# Patient Record
Sex: Male | Born: 1965 | Race: White | Hispanic: No | Marital: Married | Smoking: Former smoker
Health system: Southern US, Community
[De-identification: ages and names within clinical notes are randomized; demographics above are authoritative.]

## PROBLEM LIST (undated history)

## (undated) DIAGNOSIS — F411 Generalized anxiety disorder: Secondary | ICD-10-CM

## (undated) DIAGNOSIS — Z8489 Family history of other specified conditions: Secondary | ICD-10-CM

## (undated) DIAGNOSIS — E119 Type 2 diabetes mellitus without complications: Secondary | ICD-10-CM

## (undated) DIAGNOSIS — K219 Gastro-esophageal reflux disease without esophagitis: Secondary | ICD-10-CM

## (undated) DIAGNOSIS — Z8659 Personal history of other mental and behavioral disorders: Secondary | ICD-10-CM

## (undated) DIAGNOSIS — I10 Essential (primary) hypertension: Secondary | ICD-10-CM

## (undated) HISTORY — DX: Personal history of other mental and behavioral disorders: Z86.59

## (undated) HISTORY — DX: Gastro-esophageal reflux disease without esophagitis: K21.9

## (undated) HISTORY — PX: COLONOSCOPY: SHX174

## (undated) HISTORY — DX: Type 2 diabetes mellitus without complications: E11.9

## (undated) HISTORY — DX: Essential (primary) hypertension: I10

## (undated) HISTORY — DX: Generalized anxiety disorder: F41.1

---

## 1995-01-11 HISTORY — PX: APPENDECTOMY: SHX54

## 1997-01-10 HISTORY — PX: VASECTOMY: SHX75

## 2000-10-19 ENCOUNTER — Inpatient Hospital Stay (HOSPITAL_COMMUNITY): Admission: EM | Admit: 2000-10-19 | Discharge: 2000-10-21 | Payer: Self-pay | Admitting: *Deleted

## 2000-10-19 ENCOUNTER — Encounter: Payer: Self-pay | Admitting: *Deleted

## 2000-10-19 ENCOUNTER — Encounter: Payer: Self-pay | Admitting: Internal Medicine

## 2000-10-21 ENCOUNTER — Encounter: Payer: Self-pay | Admitting: Endocrinology

## 2001-03-14 ENCOUNTER — Encounter: Admission: RE | Admit: 2001-03-14 | Discharge: 2001-06-12 | Payer: Self-pay | Admitting: Internal Medicine

## 2004-01-22 ENCOUNTER — Ambulatory Visit: Payer: Self-pay | Admitting: Internal Medicine

## 2004-04-12 ENCOUNTER — Ambulatory Visit: Payer: Self-pay | Admitting: Internal Medicine

## 2004-07-16 ENCOUNTER — Ambulatory Visit: Payer: Self-pay | Admitting: Internal Medicine

## 2004-10-18 ENCOUNTER — Ambulatory Visit: Payer: Self-pay | Admitting: Internal Medicine

## 2004-12-14 ENCOUNTER — Ambulatory Visit: Payer: Self-pay | Admitting: Internal Medicine

## 2005-03-24 ENCOUNTER — Ambulatory Visit: Payer: Self-pay | Admitting: Internal Medicine

## 2005-04-07 ENCOUNTER — Ambulatory Visit: Payer: Self-pay | Admitting: Internal Medicine

## 2005-08-05 ENCOUNTER — Ambulatory Visit: Payer: Self-pay | Admitting: Internal Medicine

## 2005-11-01 ENCOUNTER — Ambulatory Visit: Payer: Self-pay | Admitting: Internal Medicine

## 2005-11-01 LAB — CONVERTED CEMR LAB: Hgb A1c MFr Bld: 5.1 % (ref 4.6–6.0)

## 2005-11-28 ENCOUNTER — Ambulatory Visit: Payer: Self-pay | Admitting: Internal Medicine

## 2006-01-10 HISTORY — PX: TOE AMPUTATION: SHX809

## 2006-04-28 ENCOUNTER — Inpatient Hospital Stay (HOSPITAL_COMMUNITY): Admission: EM | Admit: 2006-04-28 | Discharge: 2006-04-29 | Payer: Self-pay | Admitting: Emergency Medicine

## 2006-04-28 ENCOUNTER — Encounter (INDEPENDENT_AMBULATORY_CARE_PROVIDER_SITE_OTHER): Payer: Self-pay | Admitting: Specialist

## 2006-05-04 ENCOUNTER — Ambulatory Visit: Payer: Self-pay | Admitting: Internal Medicine

## 2006-05-04 LAB — CONVERTED CEMR LAB: Hgb A1c MFr Bld: 6.4 % — ABNORMAL HIGH (ref 4.6–6.0)

## 2006-09-08 ENCOUNTER — Encounter: Payer: Self-pay | Admitting: Internal Medicine

## 2006-09-08 DIAGNOSIS — K219 Gastro-esophageal reflux disease without esophagitis: Secondary | ICD-10-CM

## 2006-09-08 DIAGNOSIS — E119 Type 2 diabetes mellitus without complications: Secondary | ICD-10-CM

## 2006-09-08 DIAGNOSIS — E1100 Type 2 diabetes mellitus with hyperosmolarity without nonketotic hyperglycemic-hyperosmolar coma (NKHHC): Secondary | ICD-10-CM | POA: Insufficient documentation

## 2006-09-08 DIAGNOSIS — E1169 Type 2 diabetes mellitus with other specified complication: Secondary | ICD-10-CM | POA: Insufficient documentation

## 2006-09-08 DIAGNOSIS — I10 Essential (primary) hypertension: Secondary | ICD-10-CM

## 2006-09-08 HISTORY — DX: Essential (primary) hypertension: I10

## 2006-09-08 HISTORY — DX: Gastro-esophageal reflux disease without esophagitis: K21.9

## 2006-09-08 HISTORY — DX: Type 2 diabetes mellitus without complications: E11.9

## 2006-10-30 ENCOUNTER — Ambulatory Visit: Payer: Self-pay | Admitting: Internal Medicine

## 2006-10-30 DIAGNOSIS — Z8659 Personal history of other mental and behavioral disorders: Secondary | ICD-10-CM

## 2006-10-30 HISTORY — DX: Personal history of other mental and behavioral disorders: Z86.59

## 2006-10-31 LAB — CONVERTED CEMR LAB
AST: 22 units/L (ref 0–37)
Albumin: 4.1 g/dL (ref 3.5–5.2)
Basophils Absolute: 0.1 10*3/uL (ref 0.0–0.1)
Bilirubin, Direct: 0.3 mg/dL (ref 0.0–0.3)
Chloride: 103 meq/L (ref 96–112)
Cholesterol: 163 mg/dL (ref 0–200)
Creatinine,U: 47.4 mg/dL
Eosinophils Absolute: 0.1 10*3/uL (ref 0.0–0.6)
Eosinophils Relative: 1.2 % (ref 0.0–5.0)
GFR calc non Af Amer: 98 mL/min
Glucose, Bld: 209 mg/dL — ABNORMAL HIGH (ref 70–99)
HCT: 46.2 % (ref 39.0–52.0)
Hemoglobin: 16 g/dL (ref 13.0–17.0)
Hgb A1c MFr Bld: 8.2 % — ABNORMAL HIGH (ref 4.6–6.0)
Lymphocytes Relative: 23.4 % (ref 12.0–46.0)
MCHC: 34.6 g/dL (ref 30.0–36.0)
MCV: 89.7 fL (ref 78.0–100.0)
Microalb, Ur: 0.2 mg/dL (ref 0.0–1.9)
Monocytes Absolute: 0.6 10*3/uL (ref 0.2–0.7)
Neutro Abs: 6.2 10*3/uL (ref 1.4–7.7)
Neutrophils Relative %: 68.2 % (ref 43.0–77.0)
Potassium: 4.6 meq/L (ref 3.5–5.1)
RBC: 5.15 M/uL (ref 4.22–5.81)
Sodium: 142 meq/L (ref 135–145)
WBC: 9.1 10*3/uL (ref 4.5–10.5)

## 2007-01-30 ENCOUNTER — Ambulatory Visit: Payer: Self-pay | Admitting: Internal Medicine

## 2007-02-05 ENCOUNTER — Ambulatory Visit: Payer: Self-pay | Admitting: Internal Medicine

## 2007-03-01 ENCOUNTER — Telehealth: Payer: Self-pay | Admitting: Internal Medicine

## 2007-05-02 ENCOUNTER — Ambulatory Visit: Payer: Self-pay | Admitting: Internal Medicine

## 2007-05-02 LAB — CONVERTED CEMR LAB: Hgb A1c MFr Bld: 7.3 % — ABNORMAL HIGH (ref 4.6–6.0)

## 2007-08-03 ENCOUNTER — Ambulatory Visit: Payer: Self-pay | Admitting: Internal Medicine

## 2007-08-03 LAB — CONVERTED CEMR LAB: Blood Glucose, Fingerstick: 202

## 2007-08-06 ENCOUNTER — Telehealth: Payer: Self-pay | Admitting: Internal Medicine

## 2007-11-07 ENCOUNTER — Ambulatory Visit: Payer: Self-pay | Admitting: Internal Medicine

## 2007-11-09 ENCOUNTER — Telehealth (INDEPENDENT_AMBULATORY_CARE_PROVIDER_SITE_OTHER): Payer: Self-pay

## 2007-11-13 ENCOUNTER — Ambulatory Visit: Payer: Self-pay | Admitting: Internal Medicine

## 2007-11-13 DIAGNOSIS — N453 Epididymo-orchitis: Secondary | ICD-10-CM | POA: Insufficient documentation

## 2007-12-31 ENCOUNTER — Encounter: Payer: Self-pay | Admitting: Internal Medicine

## 2008-02-07 ENCOUNTER — Ambulatory Visit: Payer: Self-pay | Admitting: Internal Medicine

## 2008-02-07 LAB — CONVERTED CEMR LAB: Hgb A1c MFr Bld: 7.8 % — ABNORMAL HIGH (ref 4.6–6.0)

## 2008-03-06 ENCOUNTER — Telehealth: Payer: Self-pay | Admitting: Internal Medicine

## 2008-05-06 ENCOUNTER — Ambulatory Visit: Payer: Self-pay | Admitting: Internal Medicine

## 2008-05-08 ENCOUNTER — Telehealth: Payer: Self-pay | Admitting: Internal Medicine

## 2008-05-08 LAB — CONVERTED CEMR LAB: Hgb A1c MFr Bld: 7.6 % — ABNORMAL HIGH (ref 4.6–6.5)

## 2008-08-05 ENCOUNTER — Ambulatory Visit: Payer: Self-pay | Admitting: Internal Medicine

## 2008-08-05 DIAGNOSIS — F411 Generalized anxiety disorder: Secondary | ICD-10-CM

## 2008-08-05 DIAGNOSIS — F410 Panic disorder [episodic paroxysmal anxiety] without agoraphobia: Secondary | ICD-10-CM | POA: Insufficient documentation

## 2008-08-05 HISTORY — DX: Generalized anxiety disorder: F41.1

## 2008-11-06 ENCOUNTER — Ambulatory Visit: Payer: Self-pay | Admitting: Internal Medicine

## 2008-11-07 LAB — CONVERTED CEMR LAB: Hgb A1c MFr Bld: 8.1 % — ABNORMAL HIGH (ref 4.6–6.5)

## 2008-12-31 ENCOUNTER — Encounter: Payer: Self-pay | Admitting: Internal Medicine

## 2009-02-05 ENCOUNTER — Ambulatory Visit: Payer: Self-pay | Admitting: Internal Medicine

## 2009-02-25 ENCOUNTER — Ambulatory Visit: Payer: Self-pay | Admitting: Internal Medicine

## 2009-02-25 DIAGNOSIS — J069 Acute upper respiratory infection, unspecified: Secondary | ICD-10-CM | POA: Insufficient documentation

## 2009-02-25 DIAGNOSIS — J029 Acute pharyngitis, unspecified: Secondary | ICD-10-CM | POA: Insufficient documentation

## 2009-05-07 ENCOUNTER — Ambulatory Visit: Payer: Self-pay | Admitting: Internal Medicine

## 2009-05-07 LAB — CONVERTED CEMR LAB: Hgb A1c MFr Bld: 7.1 % — ABNORMAL HIGH (ref 4.6–6.5)

## 2009-05-18 ENCOUNTER — Telehealth: Payer: Self-pay

## 2009-06-01 ENCOUNTER — Telehealth: Payer: Self-pay | Admitting: Internal Medicine

## 2009-08-06 ENCOUNTER — Ambulatory Visit: Payer: Self-pay | Admitting: Internal Medicine

## 2009-08-06 LAB — HM DIABETES FOOT EXAM

## 2009-10-29 ENCOUNTER — Ambulatory Visit: Payer: Self-pay | Admitting: Internal Medicine

## 2009-10-29 LAB — CONVERTED CEMR LAB
Blood in Urine, dipstick: NEGATIVE
Ketones, urine, test strip: NEGATIVE
Nitrite: NEGATIVE
Urobilinogen, UA: 0.2
WBC Urine, dipstick: NEGATIVE

## 2009-10-30 LAB — CONVERTED CEMR LAB
Alkaline Phosphatase: 57 units/L (ref 39–117)
Basophils Relative: 1.2 % (ref 0.0–3.0)
Bilirubin, Direct: 0.1 mg/dL (ref 0.0–0.3)
Calcium: 8.5 mg/dL (ref 8.4–10.5)
Creatinine, Ser: 0.8 mg/dL (ref 0.4–1.5)
Eosinophils Absolute: 0.1 10*3/uL (ref 0.0–0.7)
Eosinophils Relative: 1.1 % (ref 0.0–5.0)
GFR calc non Af Amer: 109.35 mL/min (ref >60)
HDL: 44.6 mg/dL (ref >39.00)
LDL Cholesterol: 78 mg/dL (ref 0–99)
Lymphocytes Relative: 26.2 % (ref 12.0–46.0)
MCHC: 34.4 g/dL (ref 30.0–36.0)
Microalb, Ur: 0.4 mg/dL (ref 0.0–1.9)
Monocytes Relative: 6.4 % (ref 3.0–12.0)
Neutrophils Relative %: 65.1 % (ref 43.0–77.0)
RBC: 4.94 M/uL (ref 4.22–5.81)
Total CHOL/HDL Ratio: 3
Total Protein: 5.8 g/dL — ABNORMAL LOW (ref 6.0–8.3)
Triglycerides: 127 mg/dL (ref 0.0–149.0)
VLDL: 25.4 mg/dL (ref 0.0–40.0)
WBC: 8.9 10*3/uL (ref 4.5–10.5)

## 2009-11-05 ENCOUNTER — Ambulatory Visit: Payer: Self-pay | Admitting: Internal Medicine

## 2010-01-29 ENCOUNTER — Encounter: Payer: Self-pay | Admitting: Internal Medicine

## 2010-02-04 ENCOUNTER — Ambulatory Visit
Admission: RE | Admit: 2010-02-04 | Discharge: 2010-02-04 | Payer: Self-pay | Source: Home / Self Care | Attending: Internal Medicine | Admitting: Internal Medicine

## 2010-02-04 LAB — HM DIABETES EYE EXAM: HM Diabetic Eye Exam: NORMAL

## 2010-02-09 NOTE — Assessment & Plan Note (Signed)
Summary: cpx//ccm   Vital Signs:  Patient profile:   45 year old male Height:      67 inches Weight:      236 pounds BMI:     37.10 Temp:     98.2 degrees F oral Pulse rate:   88 / minute Pulse rhythm:   regular BP sitting:   116 / 82  (left arm) Cuff size:   large  Vitals Entered By: Alfred Levins, CMA (November 05, 2009 9:20 AM)  History of Present Illness: 45 year old patient who is seen today for a preventative health examination.  Medical problems include type 2 diabetes, hypertension, and exogenous obesity.  He has had suboptimal diabetic control, and no, weight loss since his last visit.  His hemoglobin A1c remains elevated.  Lifestyle issues discussed at length.  He wishes to try 3 months of more aggressive lifestyle management before initiating insulin treatment.  He is on Byetta but often decreases dosing or skipped dosing due to cost considerations.  Preventive Screening-Counseling & Management  Caffeine-Diet-Exercise     Does Patient Exercise: yes  Current Medications (verified): 1)  Lorazepam 0.5 Mg Tabs (Lorazepam) .Marland Kitchen.. 1 Two Times A Day Prn 2)  Metformin Hcl 1000 Mg  Tabs (Metformin Hcl) .... One Tablet Two Times A Day 3)  Amaryl 4 Mg  Tabs (Glimepiride) .... Once Daily 4)  Byetta 10 Mcg Pen 10 Mcg/0.2ml Soln (Exenatide) .... Used Twice Daily 5)  Lisinopril-Hydrochlorothiazide 20-12.5 Mg Tabs (Lisinopril-Hydrochlorothiazide) .Marland Kitchen.. 1 Once Daily 6)  Bd Hypodermic Needle 23g X 1-1/2" Misc (Needle (Disp))  Allergies (verified): No Known Drug Allergies  Past History:  Past Medical History: Reviewed history from 08/05/2008 and no changes required. Diabetes mellitus, type II GERD Hypertension Anxiety  Past Surgical History: Reviewed history from 09/08/2006 and no changes required. Appendectomy Toe amputation, trauma Vasectomy  Family History: Reviewed history from 10/30/2006 and no changes required. father, history of hypertension, hypercholesterolemia,  status post CABG at age 67 mother history of type 2 diabetes one brother is well  Social History: Reviewed history and no changes required. Married Regular exercise-yes set of twins (boys) age 57Does Patient Exercise:  yes  Review of Systems  The patient denies anorexia, fever, weight loss, weight gain, vision loss, decreased hearing, hoarseness, chest pain, syncope, dyspnea on exertion, peripheral edema, prolonged cough, headaches, hemoptysis, abdominal pain, melena, hematochezia, severe indigestion/heartburn, hematuria, incontinence, genital sores, muscle weakness, suspicious skin lesions, transient blindness, difficulty walking, depression, unusual weight change, abnormal bleeding, enlarged lymph nodes, angioedema, breast masses, and testicular masses.    Physical Exam  General:  overweight-appearing.  normal blood pressure Head:  Normocephalic and atraumatic without obvious abnormalities. No apparent alopecia or balding. Eyes:  No corneal or conjunctival inflammation noted. EOMI. Perrla. Funduscopic exam benign, without hemorrhages, exudates or papilledema. Vision grossly normal. Ears:  External ear exam shows no significant lesions or deformities.  Otoscopic examination reveals clear canals, tympanic membranes are intact bilaterally without bulging, retraction, inflammation or discharge. Hearing is grossly normal bilaterally. Nose:  External nasal examination shows no deformity or inflammation. Nasal mucosa are pink and moist without lesions or exudates. Mouth:  Oral mucosa and oropharynx without lesions or exudates.  Teeth in good repair. Neck:  No deformities, masses, or tenderness noted. Chest Wall:  No deformities, masses, tenderness or gynecomastia noted. Breasts:  No masses or gynecomastia noted Lungs:  Normal respiratory effort, chest expands symmetrically. Lungs are clear to auscultation, no crackles or wheezes. Heart:  Normal rate and regular rhythm. S1 and  S2 normal without  gallop, murmur, click, rub or other extra sounds. Abdomen:  Bowel sounds positive,abdomen soft and non-tender without masses, organomegaly or hernias noted. Genitalia:  Testes bilaterally descended without nodularity, tenderness or masses. No scrotal masses or lesions. No penis lesions or urethral discharge. Msk:  No deformity or scoliosis noted of thoracic or lumbar spine.   Pulses:  R and L carotid,radial,femoral,dorsalis pedis and posterior tibial pulses are full and equal bilaterally Extremities:  No clubbing, cyanosis, edema, or deformity noted with normal full range of motion of all joints.   Neurologic:  No cranial nerve deficits noted. Station and gait are normal. Plantar reflexes are down-going bilaterally. DTRs are symmetrical throughout. Sensory, motor and coordinative functions appear intact. Skin:  Intact without suspicious lesions or rashes Cervical Nodes:  No lymphadenopathy noted Axillary Nodes:  No palpable lymphadenopathy Inguinal Nodes:  No significant adenopathy Psych:  Cognition and judgment appear intact. Alert and cooperative with normal attention span and concentration. No apparent delusions, illusions, hallucinations   Impression & Recommendations:  Problem # 1:  Preventive Health Care (ICD-V70.0)  Complete Medication List: 1)  Lorazepam 0.5 Mg Tabs (Lorazepam) .Marland Kitchen.. 1 two times a day prn 2)  Metformin Hcl 1000 Mg Tabs (metformin Hcl)  .... One tablet two times a day 3)  Amaryl 4 Mg Tabs (Glimepiride) .... Once daily 4)  Byetta 10 Mcg Pen 10 Mcg/0.74ml Soln (Exenatide) .... Used twice daily 5)  Lisinopril-hydrochlorothiazide 20-12.5 Mg Tabs (Lisinopril-hydrochlorothiazide) .Marland Kitchen.. 1 once daily 6)  Bd Hypodermic Needle 23g X 1-1/2" Misc (Needle (disp))  Other Orders: Admin 1st Vaccine (16109) Flu Vaccine 9yrs + (60454)  Patient Instructions: 1)  Please schedule a follow-up appointment in 3 months. 2)  Limit your Sodium (Salt). 3)  It is important that you exercise  regularly at least 20 minutes 5 times a week. If you develop chest pain, have severe difficulty breathing, or feel very tired , stop exercising immediately and seek medical attention. 4)  You need to lose weight. Consider a lower calorie diet and regular exercise.  5)  Check your blood sugars regularly. If your readings are usually above : or below 70 you should contact our office. 6)  It is important that your Diabetic A1c level is checked every 3 months. 7)  See your eye doctor yearly to check for diabetic eye damage. Flu Vaccine Consent Questions     Do you have a history of severe allergic reactions to this vaccine? no    Any prior history of allergic reactions to egg and/or gelatin? no    Do you have a sensitivity to the preservative Thimersol? no    Do you have a past history of Guillan-Barre Syndrome? no    Do you currently have an acute febrile illness? no    Have you ever had a severe reaction to latex? no    Vaccine information given and explained to patient? yes    Are you currently pregnant? no    Lot Number:[id number]   Exp Date:07/10/2010   Site Given  Left Deltoid IM  Orders Added: 1)  Admin 1st Vaccine [90471] 2)  Flu Vaccine 52yrs + [90658] 3)  Est. Patient 40-64 years [99396]   .[email or web address]

## 2010-02-09 NOTE — Assessment & Plan Note (Signed)
Summary: 3 month rov/njr   Vital Signs:  Patient profile:   45 year old male Weight:      229 pounds Temp:     98.1 degrees F oral BP sitting:   130 / 80  (right arm) Cuff size:   regular  Vitals Entered By: Duard Brady LPN (May 07, 2009 8:21 AM) CC: 3 month ROV. pt doing well [id number] Is Patient Diabetic? Yes   CC:  3 month ROV. pt doing well [id number].  History of Present Illness: 45 year old patient has a history of type 2 diabetes.  He has a history of treated hypertension, exogenous obesity.  He has done quite well and feels it is blood sugars are trending down.  Since his last visit here.  He has lost some weight.  He has a high deductible.  Plan and is having a very difficult time affording his medications.  He is using Byetta only daily.  His hemoglobin A1c is having trending down  Preventive Screening-Counseling & Management  Alcohol-Tobacco     Smoking Status: never  Allergies: No Known Drug Allergies  Past History:  Past Medical History: Reviewed history from 08/05/2008 and no changes required. Diabetes mellitus, type II GERD Hypertension Anxiety  Social History: Smoking Status:  never  Review of Systems       The patient complains of weight loss.  The patient denies anorexia, fever, weight gain, vision loss, decreased hearing, hoarseness, chest pain, syncope, dyspnea on exertion, peripheral edema, prolonged cough, headaches, hemoptysis, abdominal pain, melena, hematochezia, severe indigestion/heartburn, hematuria, incontinence, genital sores, muscle weakness, suspicious skin lesions, transient blindness, difficulty walking, depression, unusual weight change, abnormal bleeding, enlarged lymph nodes, angioedema, breast masses, and testicular masses.    Physical Exam  General:  overweight-appearing.  110/70overweight-appearing.   Head:  Normocephalic and atraumatic without obvious abnormalities. No apparent alopecia or balding. Eyes:  No corneal or  conjunctival inflammation noted. EOMI. Perrla. Funduscopic exam benign, without hemorrhages, exudates or papilledema. Vision grossly normal. Mouth:  Oral mucosa and oropharynx without lesions or exudates.  Teeth in good repair. Neck:  No deformities, masses, or tenderness noted. Lungs:  Normal respiratory effort, chest expands symmetrically. Lungs are clear to auscultation, no crackles or wheezes. Heart:  Normal rate and regular rhythm. S1 and S2 normal without gallop, murmur, click, rub or other extra sounds. Abdomen:  Bowel sounds positive,abdomen soft and non-tender without masses, organomegaly or hernias noted. Msk:  No deformity or scoliosis noted of thoracic or lumbar spine.   Extremities:  No clubbing, cyanosis, edema, or deformity noted with normal full range of motion of all joints.     Impression & Recommendations:  Problem # 1:  HYPERTENSION (ICD-401.9)  His updated medication list for this problem includes:    Lisinopril-hydrochlorothiazide 20-12.5 Mg Tabs (Lisinopril-hydrochlorothiazide) .Marland Kitchen... 1 once daily  His updated medication list for this problem includes:    Lisinopril-hydrochlorothiazide 20-12.5 Mg Tabs (Lisinopril-hydrochlorothiazide) .Marland Kitchen... 1 once daily  Problem # 2:  DIABETES MELLITUS, TYPE II (ICD-250.00)  His updated medication list for this problem includes:    Lisinopril-hydrochlorothiazide 20-12.5 Mg Tabs (Lisinopril-hydrochlorothiazide) .Marland Kitchen... 1 once daily    Amaryl 4 Mg Tabs (Glimepiride) ..... Once daily    Byetta 10 Mcg Pen 10 Mcg/0.43ml Soln (Exenatide) ..... Used twice daily    His updated medication list for this problem includes:    Lisinopril-hydrochlorothiazide 20-12.5 Mg Tabs (Lisinopril-hydrochlorothiazide) .Marland Kitchen... 1 once daily    Amaryl 4 Mg Tabs (Glimepiride) ..... Once daily    Byetta 10  Mcg Pen 10 Mcg/0.15ml Soln (Exenatide) ..... Used twice daily  Orders: Venipuncture (95621) TLB-A1C / Hgb A1C (Glycohemoglobin) (83036-A1C)  Complete  Medication List: 1)  Lisinopril-hydrochlorothiazide 20-12.5 Mg Tabs (Lisinopril-hydrochlorothiazide) .Marland Kitchen.. 1 once daily 2)  Lorazepam 0.5 Mg Tabs (Lorazepam) .Marland Kitchen.. 1 two times a day prn 3)  Metformin Hcl 1000 Mg Tabs (metformin Hcl)  .... One tablet two times a day 4)  Amaryl 4 Mg Tabs (Glimepiride) .... Once daily 5)  Byetta 10 Mcg Pen 10 Mcg/0.23ml Soln (Exenatide) .... Used twice daily  Patient Instructions: 1)  Please schedule a follow-up appointment in 3 months. 2)  Limit your Sodium (Salt). 3)  It is important that you exercise regularly at least 20 minutes 5 times a week. If you develop chest pain, have severe difficulty breathing, or feel very tired , stop exercising immediately and seek medical attention. 4)  You need to lose weight. Consider a lower calorie diet and regular exercise.  5)  Check your blood sugars regularly. If your readings are usually above : or below 70 you should contact our office. 6)  It is important that your Diabetic A1c level is checked every 3 months. 7)  See your eye doctor yearly to check for diabetic eye damage.

## 2010-02-09 NOTE — Progress Notes (Signed)
Summary: REQ FOR RESULTS ([facility])  Phone Note Call from Patient   Caller: Patient  [phone] Summary of Call: Pt called to obtain results of recent labwork.... Pt adv that he can be reached at [phone].  Initial call taken by: Debbra Riding,  May 18, 2009 8:06 AM  Follow-up for Phone Call        spoke with pt - discussed lab - encouraged diet and exercise , continue current meds. KIK Follow-up by: Duard Brady LPN,  May 18, 6293 8:36 AM

## 2010-02-09 NOTE — Assessment & Plan Note (Signed)
Summary: ST/BURNING THROAT/SORE/RCD   Vital Signs:  Patient profile:   45 year old male Weight:      236 pounds Temp:     98.7 degrees F oral BP sitting:   110 / 70  (left arm) Cuff size:   regular  Vitals Entered By: Duard Brady LPN (February 25, 2009 10:11 AM) CC: c/o sore throat , conestion - son with strep , they are going out of town this wkend.   CC:  c/o sore throat , conestion - son with strep , and they are going out of town this wkend..  History of Present Illness: 45 year old patient who has a history of diabetes and hypertension.  He presents with a several day history of sore throat.  He also has had some rhinorrhea, mild hoarseness and cough. Two children at home have been diagnosed with acute illness one with streptococcal pharyngitis.  the patient denies any fever.  A rapid strep screen today.  Negative.  He was concerned because she will be added town this weekend. His diabetes remained stable.  Allergies: No Known Drug Allergies  Past History:  Past Medical History: Reviewed history from 08/05/2008 and no changes required. Diabetes mellitus, type II GERD Hypertension Anxiety  Physical Exam  General:  overweight-appearing.  normal blood pressure Head:  Normocephalic and atraumatic without obvious abnormalities. No apparent alopecia or balding. Eyes:  No corneal or conjunctival inflammation noted. EOMI. Perrla. Funduscopic exam benign, without hemorrhages, exudates or papilledema. Vision grossly normal. Ears:  External ear exam shows no significant lesions or deformities.  Otoscopic examination reveals clear canals, tympanic membranes are intact bilaterally without bulging, retraction, inflammation or discharge. Hearing is grossly normal bilaterally. Mouth:  pharyngeal erythema.   Neck:  No deformities, masses, or tenderness noted. Lungs:  Normal respiratory effort, chest expands symmetrically. Lungs are clear to auscultation, no crackles or  wheezes. Heart:  Normal rate and regular rhythm. S1 and S2 normal without gallop, murmur, click, rub or other extra sounds.   Impression & Recommendations:  Problem # 1:  URI (ICD-465.9) patient's clinical syndrome does not suggest strep, although his strep exposure.  History is bothersome.  Will give the patient a prescription for an antibiotic that he will not fill unless he develops fever or clinical worsening  Problem # 2:  SORE THROAT (ICD-462)  Orders: Rapid Strep (87880)-negative  Problem # 3:  DIABETES MELLITUS, TYPE II (ICD-250.00)  His updated medication list for this problem includes:    Lisinopril-hydrochlorothiazide 20-12.5 Mg Tabs (Lisinopril-hydrochlorothiazide) .Marland Kitchen... 1 once daily    Amaryl 4 Mg Tabs (Glimepiride) ..... Once daily    Byetta 10 Mcg Pen 10 Mcg/0.19ml Soln (Exenatide) ..... Used twice daily  Complete Medication List: 1)  Lisinopril-hydrochlorothiazide 20-12.5 Mg Tabs (Lisinopril-hydrochlorothiazide) .Marland Kitchen.. 1 once daily 2)  Lorazepam 0.5 Mg Tabs (Lorazepam) .Marland Kitchen.. 1 two times a day prn 3)  Metformin Hcl 1000 Mg Tabs (metformin Hcl)  .... One tablet two times a day 4)  Amaryl 4 Mg Tabs (Glimepiride) .... Once daily 5)  Sertraline Hcl 50 Mg Tabs (Sertraline hcl) .... One daily 6)  Byetta 10 Mcg Pen 10 Mcg/0.74ml Soln (Exenatide) .... Used twice daily  Patient Instructions: 1)  Get plenty of rest, drink lots of clear liquids, and use Tylenol or Ibuprofen for fever and comfort. Return in 7-10 days if you're not better:sooner if you're feeling worse. 2)  Please schedule a follow-up appointment in 3 months.

## 2010-02-09 NOTE — Assessment & Plan Note (Signed)
Summary: 3 month rov/njr   Vital Signs:  Patient profile:   45 year old male Weight:      234 pounds Temp:     98.2 degrees F oral BP sitting:   110 / 78  (right arm) Cuff size:   regular  Vitals Entered By: Duard Brady LPN (August 06, 2009 8:15 AM) CC: 3 mos rov - doing well  , needs refills     fbs 229 Is Patient Diabetic? Yes Did you bring your meter with you today? No   CC:  3 mos rov - doing well   and needs refills     fbs 229.  History of Present Illness: 45 year old patient seen today for follow-up of his type 2 diabetes.  Medical regimen includes Byetta; he is only taken 5 micrograms twice daily due to cost considerations.  His hemoglobin A1c, the last two visits has dropped from 7.6 to 7.1.  Since his last visit here.  There is been some modest weight gain.  He feels his glycemic control is about the same.  He has treated hypertension, which has been stable.  No new concerns or complaints.  He has a history of anxiety disorder, which has been stable on his present regimen.  He continues to tolerate his blood pressure medications well.  He has been compliant with his medications  Allergies (verified): No Known Drug Allergies  Past History:  Past Medical History: Reviewed history from 08/05/2008 and no changes required. Diabetes mellitus, type II GERD Hypertension Anxiety  Past Surgical History: Reviewed history from 09/08/2006 and no changes required. Appendectomy Toe amputation, trauma Vasectomy  Review of Systems       The patient complains of weight gain.  The patient denies anorexia, fever, weight loss, vision loss, decreased hearing, hoarseness, chest pain, syncope, dyspnea on exertion, peripheral edema, prolonged cough, headaches, hemoptysis, abdominal pain, melena, hematochezia, severe indigestion/heartburn, hematuria, incontinence, genital sores, muscle weakness, suspicious skin lesions, transient blindness, difficulty walking, depression, unusual  weight change, abnormal bleeding, enlarged lymph nodes, angioedema, breast masses, and testicular masses.    Physical Exam  General:  overweight-appearing.  118/78overweight-appearing.   Head:  Normocephalic and atraumatic without obvious abnormalities. No apparent alopecia or balding. Eyes:  No corneal or conjunctival inflammation noted. EOMI. Perrla. Funduscopic exam benign, without hemorrhages, exudates or papilledema. Vision grossly normal. Mouth:  Oral mucosa and oropharynx without lesions or exudates.  Teeth in good repair. Neck:  No deformities, masses, or tenderness noted. Lungs:  Normal respiratory effort, chest expands symmetrically. Lungs are clear to auscultation, no crackles or wheezes. Heart:  Normal rate and regular rhythm. S1 and S2 normal without gallop, murmur, click, rub or other extra sounds. Abdomen:  Bowel sounds positive,abdomen soft and non-tender without masses, organomegaly or hernias noted. Msk:  No deformity or scoliosis noted of thoracic or lumbar spine.   Pulses:  R and L carotid,radial,femoral,dorsalis pedis and posterior tibial pulses are full and equal bilaterally  Diabetes Management Exam:    Foot Exam (with socks and/or shoes not present):       Sensory-Pinprick/Light touch:          Left medial foot (L-4): normal          Left dorsal foot (L-5): normal          Left lateral foot (S-1): normal          Right medial foot (L-4): normal          Right dorsal foot (L-5): normal  Right lateral foot (S-1): normal       Sensory-Monofilament:          Left foot: normal          Right foot: normal       Inspection:          Left foot: normal          Right foot: normal       Nails:          Left foot: normal          Right foot: normal    Foot Exam by Podiatrist:       Date: 08/06/2009       Results: no diabetic findings       Done by: PCP   Impression & Recommendations:  Problem # 1:  HYPERTENSION (ICD-401.9)  His updated medication list  for this problem includes:    Lisinopril-hydrochlorothiazide 20-12.5 Mg Tabs (Lisinopril-hydrochlorothiazide) .Marland Kitchen... 1 once daily  His updated medication list for this problem includes:    Lisinopril-hydrochlorothiazide 20-12.5 Mg Tabs (Lisinopril-hydrochlorothiazide) .Marland Kitchen... 1 once daily  Problem # 2:  DIABETES MELLITUS, TYPE II (ICD-250.00)  His updated medication list for this problem includes:    Amaryl 4 Mg Tabs (Glimepiride) ..... Once daily    Byetta 10 Mcg Pen 10 Mcg/0.66ml Soln (Exenatide) ..... Used twice daily    Lisinopril-hydrochlorothiazide 20-12.5 Mg Tabs (Lisinopril-hydrochlorothiazide) .Marland Kitchen... 1 once daily    His updated medication list for this problem includes:    Amaryl 4 Mg Tabs (Glimepiride) ..... Once daily    Byetta 10 Mcg Pen 10 Mcg/0.64ml Soln (Exenatide) ..... Used twice daily    Lisinopril-hydrochlorothiazide 20-12.5 Mg Tabs (Lisinopril-hydrochlorothiazide) .Marland Kitchen... 1 once daily  Problem # 3:  PANIC DISORDER, HX OF (ICD-V11.8)  Complete Medication List: 1)  Lorazepam 0.5 Mg Tabs (Lorazepam) .Marland Kitchen.. 1 two times a day prn 2)  Metformin Hcl 1000 Mg Tabs (metformin Hcl)  .... One tablet two times a day 3)  Amaryl 4 Mg Tabs (Glimepiride) .... Once daily 4)  Byetta 10 Mcg Pen 10 Mcg/0.15ml Soln (Exenatide) .... Used twice daily 5)  Lisinopril-hydrochlorothiazide 20-12.5 Mg Tabs (Lisinopril-hydrochlorothiazide) .Marland Kitchen.. 1 once daily 6)  Bd Hypodermic Needle 23g X 1-1/2" Misc (Needle (disp))  Other Orders: Venipuncture (43329) TLB-A1C / Hgb A1C (Glycohemoglobin) (83036-A1C) Prescription Created Electronically [phone])  Patient Instructions: 1)  Please schedule a follow-up appointment in 3 months FOR CPX 2)  Limit your Sodium (Salt). 3)  It is important that you exercise regularly at least 20 minutes 5 times a week. If you develop chest pain, have severe difficulty breathing, or feel very tired , stop exercising immediately and seek medical attention. 4)  You need to lose  weight. Consider a lower calorie diet and regular exercise.  5)  Check your blood sugars regularly. If your readings are usually above : or below 70 you should contact our office. 6)  It is important that your Diabetic A1c level is checked every 3 months. 7)  See your eye doctor yearly to check for diabetic eye damage. Prescriptions: BD HYPODERMIC NEEDLE 23G X 1-1/2" MISC (NEEDLE (DISP))   #180 x 6   Entered and Authorized by:   Gordy Savers  MD   Signed by:   Gordy Savers  MD on 08/06/2009   Method used:   Print then Give to Patient   RxID:   [id number] BYETTA 10 MCG PEN 10 MCG/0.04ML SOLN (EXENATIDE) used twice daily  #3 pens x 6  Entered and Authorized by:   Gordy Savers  MD   Signed by:   Gordy Savers  MD on 08/06/2009   Method used:   Print then Give to Patient   RxID:   [id number] AMARYL 4 MG  TABS (GLIMEPIRIDE) once daily  #90 x 6   Entered and Authorized by:   Gordy Savers  MD   Signed by:   Gordy Savers  MD on 08/06/2009   Method used:   Print then Give to Patient   RxID:   [id number] LORAZEPAM 0.5 MG TABS (LORAZEPAM) 1 two times a day prn  #90 x 3   Entered and Authorized by:   Gordy Savers  MD   Signed by:   Gordy Savers  MD on 08/06/2009   Method used:   Print then Give to Patient   RxID:   [id number] LISINOPRIL-HYDROCHLOROTHIAZIDE 20-12.5 MG TABS (LISINOPRIL-HYDROCHLOROTHIAZIDE) 1 once daily  #90 x 6   Entered and Authorized by:   Gordy Savers  MD   Signed by:   Gordy Savers  MD on 08/06/2009   Method used:   Print then Give to Patient   RxID:   [id number] BYETTA 10 MCG PEN 10 MCG/0.04ML SOLN (EXENATIDE) used twice daily  #3 pens x 6   Entered and Authorized by:   Gordy Savers  MD   Signed by:   Gordy Savers  MD on 08/06/2009   Method used:   Electronically to        [facility]  [phone]* (retail)       [address] [place], [place]   [address]       Ph: [phone] or [phone]       Fax: [phone]   RxID:   [id number] AMARYL 4 MG  TABS (GLIMEPIRIDE) once daily  #90 x 6   Entered and Authorized by:   Gordy Savers  MD   Signed by:   Gordy Savers  MD on 08/06/2009   Method used:   Electronically to        [facility]  [phone]* (retail)       [address] [place], [place]  [address]       Ph: [phone] or [phone]       Fax: [phone]   RxID:   [id number] METFORMIN HCL 1000 MG  TABS (METFORMIN HCL) one tablet two times a day  #180 x 6   Entered and Authorized by:   Gordy Savers  MD   Signed by:   Gordy Savers  MD on 08/06/2009   Method used:   Print then Give to Patient   RxID:   [id number] LISINOPRIL-HYDROCHLOROTHIAZIDE 20-12.5 MG TABS (LISINOPRIL-HYDROCHLOROTHIAZIDE) 1 once daily  #90 x 6   Entered and Authorized by:   Gordy Savers  MD   Signed by:   Gordy Savers  MD on 08/06/2009   Method used:   Electronically to        [facility]  [phone]* (retail)       [address] [place], [place]  [address]       Ph: [phone] or [phone]       Fax: [phone]   RxID:   [id number]   Appended Document: Orders Update    Clinical Lists Changes  Orders: Added new  Service order of Specimen Handling ([address]) - Signed

## 2010-02-09 NOTE — Progress Notes (Signed)
Summary: REFILL REQUEST (Lorazepam)  Phone Note Refill Request Call back at [phone]   Refills Requested: Medication #1:  LORAZEPAM 0.5 MG TABS 1 two times a day prn   Notes: 90-Day supply.....[facility] [address]     Initial call taken by: Debbra Riding,  Jun 01, 2009 8:22 AM  Follow-up for Phone Call        [facility]    Prescriptions: LORAZEPAM 0.5 MG TABS (LORAZEPAM) 1 two times a day prn  #90 x 3   Entered by:   Duard Brady LPN   Authorized by:   Gordy Savers  MD   Signed by:   Duard Brady LPN on 93/23/5573   Method used:   Historical   RxID:   [id number]

## 2010-02-09 NOTE — Assessment & Plan Note (Signed)
Summary: ROA / 3 MTHS / RS   Vital Signs:  Patient profile:   45 year old male Weight:      232 pounds BP sitting:   114 / 88  (left arm) Cuff size:   regular  Vitals Entered By: Raechel Ache, RN (February 05, 2009 8:08 AM) CC: 3 mo ROV. BS's under 200. C/o back pain Is Patient Diabetic? Yes   CC:  3 mo ROV. BS's under 200. C/o back pain.  History of Present Illness: 45 year old patient who is seen today for follow-up of his hypertension and type 2 diabetes.  His last hemoglobin  A1c was 8.1.  he states his fasting blood sugars often approach 200.  He is concern that throughout the day and he occasionally has hypoglycemic reactions.  He basically feels weak and slightly diaphoretic, but the symptoms do not seem to be relieved by ingestion of carbohydrates and single last several hours.  He has not documented a blood sugar less than 90.  His weight is down modestly compared with last visit.  He will be much more active in the spring without or activities including sports with his twin sons.  Allergies: No Known Drug Allergies  Past History:  Past Medical History: Reviewed history from 08/05/2008 and no changes required. Diabetes mellitus, type II GERD Hypertension Anxiety  Review of Systems  The patient denies anorexia, fever, weight loss, weight gain, vision loss, decreased hearing, hoarseness, chest pain, syncope, dyspnea on exertion, peripheral edema, prolonged cough, headaches, hemoptysis, abdominal pain, melena, hematochezia, severe indigestion/heartburn, hematuria, incontinence, genital sores, muscle weakness, suspicious skin lesions, transient blindness, difficulty walking, depression, unusual weight change, abnormal bleeding, enlarged lymph nodes, angioedema, breast masses, and testicular masses.    Physical Exam  General:  overweight-appearing.  120/80overweight-appearing.   Head:  Normocephalic and atraumatic without obvious abnormalities. No apparent alopecia or  balding. Mouth:  Oral mucosa and oropharynx without lesions or exudates.  Teeth in good repair. Neck:  No deformities, masses, or tenderness noted. Lungs:  Normal respiratory effort, chest expands symmetrically. Lungs are clear to auscultation, no crackles or wheezes. Heart:  Normal rate and regular rhythm. S1 and S2 normal without gallop, murmur, click, rub or other extra sounds. Msk:  No deformity or scoliosis noted of thoracic or lumbar spine.     Impression & Recommendations:  Problem # 1:  DIABETES MELLITUS, TYPE II (ICD-250.00)  His updated medication list for this problem includes:    Lisinopril-hydrochlorothiazide 20-12.5 Mg Tabs (Lisinopril-hydrochlorothiazide) .Marland Kitchen... 1 once daily    Amaryl 4 Mg Tabs (Glimepiride) ..... Once daily    Byetta 10 Mcg Pen 10 Mcg/0.51ml Soln (Exenatide) ..... Used twice daily    His updated medication list for this problem includes:    Lisinopril-hydrochlorothiazide 20-12.5 Mg Tabs (Lisinopril-hydrochlorothiazide) .Marland Kitchen... 1 once daily    Amaryl 4 Mg Tabs (Glimepiride) ..... Once daily    Byetta 10 Mcg Pen 10 Mcg/0.58ml Soln (Exenatide) ..... Used twice daily  Orders: Venipuncture (04540) TLB-A1C / Hgb A1C (Glycohemoglobin) (83036-A1C)  Problem # 2:  HYPERTENSION (ICD-401.9)  His updated medication list for this problem includes:    Lisinopril-hydrochlorothiazide 20-12.5 Mg Tabs (Lisinopril-hydrochlorothiazide) .Marland Kitchen... 1 once daily  His updated medication list for this problem includes:    Lisinopril-hydrochlorothiazide 20-12.5 Mg Tabs (Lisinopril-hydrochlorothiazide) .Marland Kitchen... 1 once daily  Complete Medication List: 1)  Lisinopril-hydrochlorothiazide 20-12.5 Mg Tabs (Lisinopril-hydrochlorothiazide) .Marland Kitchen.. 1 once daily 2)  Lorazepam 0.5 Mg Tabs (Lorazepam) .Marland Kitchen.. 1 two times a day prn 3)  Metformin Hcl 1000 Mg  Tabs (metformin Hcl)  .... One tablet two times a day 4)  Amaryl 4 Mg Tabs (Glimepiride) .... Once daily 5)  Sertraline Hcl 50 Mg Tabs  (Sertraline hcl) .... One daily 6)  Byetta 10 Mcg Pen 10 Mcg/0.18ml Soln (Exenatide) .... Used twice daily  Patient Instructions: 1)  Please schedule a follow-up appointment in 3 months. 2)  It is important that you exercise regularly at least 20 minutes 5 times a week. If you develop chest pain, have severe difficulty breathing, or feel very tired , stop exercising immediately and seek medical attention. 3)  You need to lose weight. Consider a lower calorie diet and regular exercise.  4)  Check your blood sugars regularly. If your readings are usually above : or below 70 you should contact our office. 5)  It is important that your Diabetic A1c level is checked every 3 months.

## 2010-02-11 NOTE — Letter (Signed)
Summary: Diabetic Eye Exam/[facility]  Diabetic Eye Exam/[facility]   Imported By: Maryln Gottron 02/04/2010 11:04:00  _____________________________________________________________________  External Attachment:    Type:   Image     Comment:   External Document

## 2010-02-11 NOTE — Assessment & Plan Note (Signed)
Summary: 3 MONTH FUP//CCM   Vital Signs:  Patient profile:   45 year old male Weight:      236 pounds Temp:     98.1 degrees F oral BP sitting:   120 / 70  (right arm) Cuff size:   regular  Vitals Entered By: Duard Brady LPN (February 04, 2010 8:13 AM) CC: 3 mos rov - ok     fbs 213 Is Patient Diabetic? Yes Did you bring your meter with you today? No   CC:  3 mos rov - ok     fbs 213.  History of Present Illness: 45 year old patient who is in today for follow-up of type 2 diabetes.  He has a history of hypertension as well as a panic disorder.  Since his last visit here.  He has had a single panic attack.  Fortunately, these occur rarely. His type 2 diabetes has not done well controlled since the spring of last year.  His hemoglobin A1c's have consistently been greater than 8 since that time.  Lifestyle issues have been discussed at length that he is been unable to exercise regularly or lose weight.  He has considerable job-related challenges.  He also states that he takes  his Byetta only once daily.  He does have a high deductible  plan and  cost issues are probably a factor. insulin therapy.  Discussed today but he wishes to have one more attempt at lifestyle and better compliance  Allergies (verified): No Known Drug Allergies  Past History:  Past Medical History: Reviewed history from 08/05/2008 and no changes required. Diabetes mellitus, type II GERD Hypertension Anxiety  Past Surgical History: Reviewed history from 09/08/2006 and no changes required. Appendectomy Toe amputation, trauma Vasectomy  Review of Systems  The patient denies anorexia, fever, weight loss, weight gain, vision loss, decreased hearing, hoarseness, chest pain, syncope, dyspnea on exertion, peripheral edema, prolonged cough, headaches, hemoptysis, abdominal pain, melena, hematochezia, severe indigestion/heartburn, hematuria, incontinence, genital sores, muscle weakness, suspicious skin  lesions, transient blindness, difficulty walking, depression, unusual weight change, abnormal bleeding, enlarged lymph nodes, angioedema, breast masses, and testicular masses.    Physical Exam  General:  overweight-appearing.  110/76overweight-appearing.   Head:  Normocephalic and atraumatic without obvious abnormalities. No apparent alopecia or balding. Eyes:  No corneal or conjunctival inflammation noted. EOMI. Perrla. Funduscopic exam benign, without hemorrhages, exudates or papilledema. Vision grossly normal. Mouth:  Oral mucosa and oropharynx without lesions or exudates.  Teeth in good repair. Neck:  No deformities, masses, or tenderness noted. Lungs:  Normal respiratory effort, chest expands symmetrically. Lungs are clear to auscultation, no crackles or wheezes. Heart:  Normal rate and regular rhythm. S1 and S2 normal without gallop, murmur, click, rub or other extra sounds. Abdomen:  Bowel sounds positive,abdomen soft and non-tender without masses, organomegaly or hernias noted.  Diabetes Management Exam:    Eye Exam:       Eye Exam done here today          Results: normal   Impression & Recommendations:  Problem # 1:  PANIC DISORDER, HX OF (ICD-V11.8)  Problem # 2:  HYPERTENSION (ICD-401.9)  His updated medication list for this problem includes:    Lisinopril-hydrochlorothiazide 20-12.5 Mg Tabs (Lisinopril-hydrochlorothiazide) .Marland Kitchen... 1 once daily  His updated medication list for this problem includes:    Lisinopril-hydrochlorothiazide 20-12.5 Mg Tabs (Lisinopril-hydrochlorothiazide) .Marland Kitchen... 1 once daily  Problem # 3:  DIABETES MELLITUS, TYPE II (ICD-250.00)  The following medications were removed from the medication  list:    Byetta 10 Mcg Pen 10 Mcg/0.67ml Soln (Exenatide) ..... Used twice daily His updated medication list for this problem includes:    Amaryl 4 Mg Tabs (Glimepiride) ..... Once daily    Lisinopril-hydrochlorothiazide 20-12.5 Mg Tabs  (Lisinopril-hydrochlorothiazide) .Marland Kitchen... 1 once daily    Victoza 18 Mg/24ml Soln (Liraglutide) .Marland Kitchen... 1.2  units daily    will switch to Victoza hopefully for improved compliance  The following medications were removed from the medication list:    Byetta 10 Mcg Pen 10 Mcg/0.17ml Soln (Exenatide) ..... Used twice daily His updated medication list for this problem includes:    Amaryl 4 Mg Tabs (Glimepiride) ..... Once daily    Lisinopril-hydrochlorothiazide 20-12.5 Mg Tabs (Lisinopril-hydrochlorothiazide) .Marland Kitchen... 1 once daily    Victoza 18 Mg/44ml Soln (Liraglutide) .Marland Kitchen... 1.2  units daily  Orders: Venipuncture (91478) TLB-A1C / Hgb A1C (Glycohemoglobin) (83036-A1C)  Complete Medication List: 1)  Lorazepam 0.5 Mg Tabs (Lorazepam) .Marland Kitchen.. 1 two times a day prn 2)  Metformin Hcl 1000 Mg Tabs (metformin Hcl)  .... One tablet two times a day 3)  Amaryl 4 Mg Tabs (Glimepiride) .... Once daily 4)  Lisinopril-hydrochlorothiazide 20-12.5 Mg Tabs (Lisinopril-hydrochlorothiazide) .Marland Kitchen.. 1 once daily 5)  Bd Hypodermic Needle 23g X 1-1/2" Misc (Needle (disp)) 6)  Victoza 18 Mg/60ml Soln (Liraglutide) .... 1.2  units daily  Patient Instructions: 1)  Please schedule a follow-up appointment in 3 months. 2)  Limit your Sodium (Salt). 3)  It is important that you exercise regularly at least 20 minutes 5 times a week. If you develop chest pain, have severe difficulty breathing, or feel very tired , stop exercising immediately and seek medical attention. 4)  You need to lose weight. Consider a lower calorie diet and regular exercise.  5)  Check your blood sugars regularly. If your readings are usually above : or below 70 you should contact our office. 6)  It is important that your Diabetic A1c level is checked every 3 months. Prescriptions: VICTOZA 18 MG/3ML SOLN (LIRAGLUTIDE) 1.2  units daily  #3 pens x 6   Entered and Authorized by:   Gordy Savers  MD   Signed by:   Gordy Savers  MD on 02/04/2010    Method used:   Print then Give to Patient   RxID:   [id number] BD HYPODERMIC NEEDLE 23G X 1-1/2" MISC (NEEDLE (DISP))   #180 x 6   Entered and Authorized by:   Gordy Savers  MD   Signed by:   Gordy Savers  MD on 02/04/2010   Method used:   Print then Give to Patient   RxID:   [id number] LISINOPRIL-HYDROCHLOROTHIAZIDE 20-12.5 MG TABS (LISINOPRIL-HYDROCHLOROTHIAZIDE) 1 once daily  #90 x 6   Entered and Authorized by:   Gordy Savers  MD   Signed by:   Gordy Savers  MD on 02/04/2010   Method used:   Print then Give to Patient   RxID:   [id number] AMARYL 4 MG  TABS (GLIMEPIRIDE) once daily  #90 x 6   Entered and Authorized by:   Gordy Savers  MD   Signed by:   Gordy Savers  MD on 02/04/2010   Method used:   Print then Give to Patient   RxID:   [id number] METFORMIN HCL 1000 MG  TABS (METFORMIN HCL) one tablet two times a day  #180 x 6   Entered and Authorized by:   Gordy Savers  MD   Signed  by:   Gordy Savers  MD on 02/04/2010   Method used:   Print then Give to Patient   RxID:   [id number] LORAZEPAM 0.5 MG TABS (LORAZEPAM) 1 two times a day prn  #90 x 3   Entered and Authorized by:   Gordy Savers  MD   Signed by:   Gordy Savers  MD on 02/04/2010   Method used:   Print then Give to Patient   RxID:   [id number]    Orders Added: 1)  Est. Patient Level III [84696] 2)  Venipuncture [29528] 3)  TLB-A1C / Hgb A1C (Glycohemoglobin) [41324-M0N]

## 2010-05-05 ENCOUNTER — Encounter: Payer: Self-pay | Admitting: Internal Medicine

## 2010-05-07 ENCOUNTER — Ambulatory Visit (INDEPENDENT_AMBULATORY_CARE_PROVIDER_SITE_OTHER): Payer: 59 | Admitting: Internal Medicine

## 2010-05-07 ENCOUNTER — Encounter: Payer: Self-pay | Admitting: Internal Medicine

## 2010-05-07 DIAGNOSIS — I10 Essential (primary) hypertension: Secondary | ICD-10-CM

## 2010-05-07 DIAGNOSIS — F411 Generalized anxiety disorder: Secondary | ICD-10-CM

## 2010-05-07 DIAGNOSIS — K219 Gastro-esophageal reflux disease without esophagitis: Secondary | ICD-10-CM

## 2010-05-07 DIAGNOSIS — E119 Type 2 diabetes mellitus without complications: Secondary | ICD-10-CM

## 2010-05-07 MED ORDER — LISINOPRIL-HYDROCHLOROTHIAZIDE 20-12.5 MG PO TABS: 1.0000 | ORAL_TABLET | Freq: Every day | ORAL | Status: DC

## 2010-05-07 MED ORDER — LORAZEPAM 0.5 MG PO TABS: 1.0000 mg | ORAL_TABLET | Freq: Two times a day (BID) | ORAL | Status: DC | PRN

## 2010-05-07 MED ORDER — GLIMEPIRIDE 4 MG PO TABS: 4.0000 mg | ORAL_TABLET | Freq: Every day | ORAL | Status: DC

## 2010-05-07 MED ORDER — LIRAGLUTIDE 18 MG/3ML ~~LOC~~ SOLN: 1.2000 mg | Freq: Every day | SUBCUTANEOUS | Status: DC

## 2010-05-07 NOTE — Patient Instructions (Signed)
It is important that you exercise regularly, at least 20 minutes 3 to 4 times per week.  If you develop chest pain or shortness of breath seek  medical attention.  You need to lose weight.  Consider a lower calorie diet and regular exercise.   Please check your hemoglobin A1c every 3 months  Return in 3 months for follow-up  

## 2010-05-07 NOTE — Progress Notes (Signed)
  Subjective:    Patient ID: Jonathan Gross, male    DOB: [birth year], 45 y.o.   MRN: [id number]  HPI Wt Readings from Last 3 Encounters:  05/07/10 233 lb (105.688 kg)  02/04/10 236 lb (107.049 kg)  11/05/09 236 lb (107.59 kg)   45 year old patient who has a history of poorly controlled type 2 diabetes. He was placed on Victoza 3 months ago and has been compliant at random blood sugar today 267. Hemoglobin A1c is consistently greater than 8 insulin therapy again discussed and encouraged today but he is very reluctant mainly due to weight gain there's been a modest 3 pound weight loss over the past 3 months. He does complain of increasing stress and does have chronic anxiety and a history of panic disorder he has treated hypertension which has been stable. Options were discussed. He wishes to attempt for 3 more months aggressive lifestyle changes. Review of Systems  Constitutional: Negative for fever, chills, appetite change and fatigue.  HENT: Negative for hearing loss, ear pain, congestion, sore throat, trouble swallowing, neck stiffness, dental problem, voice change and tinnitus.   Eyes: Negative for pain, discharge and visual disturbance.  Respiratory: Negative for cough, chest tightness, wheezing and stridor.   Cardiovascular: Negative for chest pain, palpitations and leg swelling.  Gastrointestinal: Negative for nausea, vomiting, abdominal pain, diarrhea, constipation, blood in stool and abdominal distention.  Genitourinary: Negative for urgency, hematuria, flank pain, discharge, difficulty urinating and genital sores.  Musculoskeletal: Negative for myalgias, back pain, joint swelling, arthralgias and gait problem.  Skin: Negative for rash.  Neurological: Negative for dizziness, syncope, speech difficulty, weakness, numbness and headaches.  Hematological: Negative for adenopathy. Does not bruise/bleed easily.  Psychiatric/Behavioral: Negative for behavioral problems and dysphoric mood. The  patient is not nervous/anxious.        Objective:   Physical Exam  Constitutional: He is oriented to person, place, and time. He appears well-developed and well-nourished. No distress.       Obese  HENT:  Head: Normocephalic.  Right Ear: External ear normal.  Left Ear: External ear normal.  Eyes: Conjunctivae and EOM are normal.  Neck: Normal range of motion.  Cardiovascular: Normal rate and normal heart sounds.   Pulmonary/Chest: Breath sounds normal.  Abdominal: Bowel sounds are normal.  Musculoskeletal: Normal range of motion. He exhibits no edema and no tenderness.  Neurological: He is alert and oriented to person, place, and time.  Psychiatric: He has a normal mood and affect. His behavior is normal.          Assessment & Plan:  Diabetes mellitus. Poorly controlled. He wishes to try lifestyle 3 more months he realizes he must have atraumatic improvement in his hemoglobin A1c or insulin therapy will be initiated at that time Hypertension stable Anxiety disorder. Lorazepam refilled

## 2010-05-10 NOTE — Progress Notes (Signed)
Quick Note:  Attempt to call - LMTCB to discuss results ______

## 2010-05-11 NOTE — Progress Notes (Signed)
Quick Note:  Attempt tocall - and mach at home # - LMTCB -need to discuss lab and diet/exercise - printed matierial availb. KIK ______

## 2010-05-28 NOTE — Discharge Summary (Signed)
[place]. [facility]  Patient:    Jonathan Gross, Jonathan Gross Visit Number: [id number] MRN: [id number]          Service Type: MED Location: [phone] Attending Physician:  Justine Null Dictated by:   Cornell Barman, P.A. Admit Date:  10/19/2000 Disc. Date: 10/20/00   CC:         Gordy Savers, M.D.   Discharge Summary  DISCHARGE DIAGNOSES: 1. Syncope. 2. Chest pain. 3. Hypertension. 4. Anxiety.  HISTORY OF PRESENT ILLNESS:  Mr. Pickup is a 45 year old, white male who presents after an episode of chest pain followed by syncope.  The episode occurred around 10 a.m. on the morning of admission.  The patient states that he was at work sitting at his desk when he developed a sharp pain in the middle of his chest.  This pain took his breath away.  He was short of breath. He also experienced nausea and diaphoresis.  He remembers putting his head on the desk and that is all he remembers.  He was awakened by another employee. He was not sure how long he was unconscious.  He was also concern that because he found that he was sitting in a chair different than the one he had originally been sitting in.  He does not recall moving to another chair.  When he awoke, his chest pain was gone.  He did feel groggy, confused and out of sorts.  He denies any history of seizure disorder.  He had no loss of bowel or bladder.  There was no witnessed seizure activity.  He denies any dyspnea on exertion and he denies history of exertional chest pain.  He does state that he has had this sharp twinge in his left axilla for several years.  In fact, this discomfort woke him from sleep two to three nights ago.  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  Cardiac risks factors include hypertension and family history.  His father is status post CABG at the age of 35.  The patient has no history of diabetes, high cholesterol or tobacco.  No previous history of  stress testing, cardiac catheterization or echocardiogram.  PAST MEDICAL HISTORY: 1. Hypertension. 2. History of PVCs. 3. Appendectomy. 4. Anxiety, recently started on Paxil.  LABORATORY DATA AND X-RAY FINDINGS:  White count 12,000, hemoglobin 16.7, CK 203.  MB 2, troponin 0.01.  EKG with sinus rhythm.  Chest x-ray showed no active disease.  HOSPITAL COURSE:  #1 - CHEST PAIN:  The patient was admitted to rule out myocardial infarction.  Cardiac enzymes were negative x 3.  EKG was without ischemia.  The patient has been scheduled for an outpatient stress Cardiolite.  #2 - SYNCOPE:  This sounds somewhat vasovagal.  There has been no evidence of cardiac ischemia or arrhythmias.  The patient has had no further syncopal episodes.  #3 - CHOLELITHIASIS:  The patient was concerned about gallbladder disease, although he denies any right upper quadrant pain.  He also denied any postprandial bloating, belching or nausea.  He also denies any history of indigestion or heartburn.  Of note, the patients LFTs were normal.  At this time, there is no indication of acute cholecystitis.  #4 - ANXIETY:  The patient does describe significant anxiety.  He does state that he was started on Paxil about two to three weeks ago.  The patient agrees that he needs to seek counseling.  He does have some obsessive compulsive behavior.  DISCHARGE LABORATORY DATA AND X-RAY FINDINGS:  Cardiac enzymes were negative.  DISCHARGE MEDICATIONS: 1. Triamterene/HCTZ 37.5/25 q.d. 2. Paxil 20 mg q.d.  FOLLOWUP:  Follow up for a stress Cardiolite on Tuesday, October 22, at 9:15 a.m.  He will follow up with Dr. Amador Cunas in the next one to two weeks. Dictated by:   Cornell Barman, P.A. Attending Physician:  Justine Null DD:  10/20/00 TD:  10/21/00 Job: [phone]

## 2010-05-28 NOTE — Discharge Summary (Signed)
[address]. [facility]  Patient:    Jonathan Gross, Jonathan Gross Visit Number: [id number] MRN: [id number]          Service Type: MED Location: [phone] Attending Physician:  Justine Null Dictated by:   Valetta Mole Swords, M.D. [facility] Admit Date:  10/19/2000 Discharge Date: 10/21/2000   CC:         Gordy Savers, M.D.   Discharge Summary  ADDENDUM:  Apparently the patient had an episode of chest discomfort while in the hospital on October 20, 2000, which was supposed to be his discharge date. The patient was evaluated by cardiology, who recommended stress Cardiolite which was performed on October 21, 2000, and that study is negative per Dr. Eden Emms. Cardiology recommended that he have a Holter monitor as an outpatient and their office will call him. The recommended that he not drive for 6 months. He will otherwise continue his medications as previously dictated. Dictated by:   Valetta Mole Swords, M.D. [facility] Attending Physician:  Justine Null DD:  10/21/00 TD:  10/22/00 Job: [phone]

## 2010-05-28 NOTE — Op Note (Signed)
NAME:  Jonathan Gross, Jonathan Gross                   ACCOUNT NO.:  [id number]   MEDICAL RECORD NO.:  [id number]          PATIENT TYPE:  INP   LOCATION:  1825                         FACILITY:  [facility]   PHYSICIAN:  Feliberto Gottron. Turner Daniels, M.D.   DATE OF BIRTH:  09/26/1965   DATE OF PROCEDURE:  04/28/2006  DATE OF DISCHARGE:                               OPERATIVE REPORT   PREOPERATIVE DIAGNOSIS:  Right foot lawnmower laceration with second toe  amputation by the lawnmower and near amputation of the great toe with  open fractures.   POSTOPERATIVE DIAGNOSIS:  Right foot lawnmower laceration with second  toe amputation by the lawnmower and near amputation of the great toe  with open fractures.   PROCEDURE:  Revision of the second toe amputation back to the MTP joint  and right great toe amputation at the MTP joint, as well, preceded by  thorough irrigation and debridement, primary closure.  He also had a  laceration over his tibia with a small chip fracture out of the shaft of  the tibia and that was irrigated and debrided, as well.  The chip  fracture out of the tibia was maybe a 1-2 mm piece of bone, it was not  structural fracture of the tibia, itself.   SURGEON:  Feliberto Gottron. Turner Daniels, M.D.   FIRST ASSISTANT:  Marshia Ly, P.A.-C.   ANESTHESIA:  General endotracheal.   ESTIMATED BLOOD LOSS:  50 mL.   FLUID REPLACEMENT:  500 mL crystalloid.   DRAINS PLACED:  None.   TOURNIQUET TIME:  30 minutes.   INDICATIONS FOR PROCEDURE:  The patient is a 45 year old man who was  cutting his grass at about 1630 hours on April 28, 2006, and was going  down an incline, slipped and fell, and his foot and leg went underneath  the lawnmower on the right side.  He sustained an amputation of the  second toe that was quite shredded at the PIP joint.  The great toe  proximal phalanx was shattered into 5-6 pieces and the laceration  covered a 270 degrees arc with only the lateral scan intact.  In  addition to shattering the  bone itself, the MTP joint was also open.  The vascularity of the great toe was in question in the emergency room.  In any event, he was taken to the emergency room at [facility].  Orthopedic consultation was obtained.  On an emergent basis, he was  taken for irrigation and debridement with removal of all dead and  devitalized tissue up to and including amputation of the great toe as  well as the second toe which had been amputated by the lawnmower.  The  risks and benefits of surgery were discussed and questions answered.   DESCRIPTION OF PROCEDURE:  The patient was identified by armband and  taken to the operating room at [facility] where the appropriate  anesthetic monitors were attached and general endotracheal anesthesia  was induced. In the emergency room, he did receive tetanus and 1 gram of  Ancef.  1 gram of Ancef was repeated in  the operating room about 2 1/2  hours after he arrived in the ER.  The tourniquet was applied high to  the right calf and the right lower extremity prepped and draped in the  usual sterile fashion from the toes to the tourniquet.  We began the  procedure by sharply debriding devitalized skin edges around the second  and great toe as well as the laceration over the tibia that was quite  sharp and clean.  Then, all wounds were pulse lavaged clean with 3  liters of pulse lavage solution.   After this had been accomplished, we went ahead and excised the proximal  phalanx from the second toe taking it back to the MTP joint, trimming  the skin edges back which were now clean and viable, in preparation for  closure. The great toe had been thoroughly cleansed and the limb was  wrapped with an Esmarch bandage and the tourniquet inflated to 250 mmHg  at the beginning of the procedure and after completing the second toe  amputation and the pulse lavage of all wounds, we went ahead and let the  tourniquet down and observed the great toe.  Unfortunately, it did not  pink up like all the other tissue did. Pretty much from the laceration  distally, it appeared to be dysvascular with essentially no blood  supply. Because of the multiple fractures of the proximal phalanx making  it structurally unstable and the dysvascular nature of the great toe, we  went ahead and completed the amputation leaving a lateral flap at the  level of the MTP joint.  The lateral flap was then used to cover the  great toe metatarsal head and trimmed.   At this point, the wounds were pulse lavaged one more time and then  closure of the tibial wound and the toe wounds was accomplished with  horizontal mattress 3-0 nylon suture leaving small gaps for drainage and  small bleeders were identified and cauterized just prior to closure. A  fluffy dressing of 4x4s, Webril and Ace wrap was then applied.  The  patient was then awakened and taken to the recovery room without  difficulty.      Feliberto Gottron. Turner Daniels, M.D.  Electronically Signed     FJR/MEDQ  D:  04/28/2006  T:  04/29/2006  Job:  [id number]

## 2010-06-21 ENCOUNTER — Telehealth: Payer: Self-pay

## 2010-06-21 NOTE — Telephone Encounter (Signed)
okay'd [facility]

## 2010-07-07 ENCOUNTER — Other Ambulatory Visit: Payer: Self-pay | Admitting: Internal Medicine

## 2010-07-07 NOTE — Telephone Encounter (Signed)
Pt called yesterday and said that he had req a refill of Lorazepam .5 mg 1 a day 45 day to 90 day supply, but pt said that Dr Amador Cunas was suppose to be increasing dosage. [facility] [address].

## 2010-07-08 MED ORDER — LORAZEPAM 0.5 MG PO TABS: 1.0000 mg | ORAL_TABLET | Freq: Two times a day (BID) | ORAL | Status: DC | PRN

## 2010-07-08 NOTE — Telephone Encounter (Signed)
[facility] 

## 2010-07-08 NOTE — Telephone Encounter (Signed)
#  90  One twice daily prn   RF 4

## 2010-07-26 ENCOUNTER — Telehealth: Payer: Self-pay | Admitting: *Deleted

## 2010-07-26 NOTE — Telephone Encounter (Signed)
Pt cut his finger yesterday,  Td is up to date but he wanted to see Dr Kirtland Bouchard.  Appt scheduled for 07/27/10

## 2010-07-27 ENCOUNTER — Encounter: Payer: Self-pay | Admitting: Internal Medicine

## 2010-07-27 ENCOUNTER — Ambulatory Visit (INDEPENDENT_AMBULATORY_CARE_PROVIDER_SITE_OTHER): Payer: 59 | Admitting: Internal Medicine

## 2010-07-27 DIAGNOSIS — S61209A Unspecified open wound of unspecified finger without damage to nail, initial encounter: Secondary | ICD-10-CM

## 2010-07-27 DIAGNOSIS — F411 Generalized anxiety disorder: Secondary | ICD-10-CM

## 2010-07-27 DIAGNOSIS — E119 Type 2 diabetes mellitus without complications: Secondary | ICD-10-CM

## 2010-07-27 DIAGNOSIS — I10 Essential (primary) hypertension: Secondary | ICD-10-CM

## 2010-07-27 DIAGNOSIS — S61011A Laceration without foreign body of right thumb without damage to nail, initial encounter: Secondary | ICD-10-CM

## 2010-07-27 LAB — HEMOGLOBIN A1C: Hgb A1c MFr Bld: 9.9 % — ABNORMAL HIGH (ref 4.6–6.5)

## 2010-07-27 MED ORDER — LORAZEPAM 0.5 MG PO TABS: 1.0000 mg | ORAL_TABLET | Freq: Two times a day (BID) | ORAL | Status: DC | PRN

## 2010-07-27 NOTE — Progress Notes (Signed)
  Subjective:    Patient ID: Jonathan Gross, male    DOB: 1965-09-16, 45 y.o.   MRN: [id number]  HPI  a 45 year old patient who is seen today for followup. He sustained a laceration to the tip of his right thumb 3 days ago. He has been on Victoza 1.2 units daily he has had some nausea. There has been some modest weight loss. Fasting blood sugar this morning was 252. He states he checks blood sugars infrequently. There has been some modest weight loss. Last hemoglobin A1c 9.3  Wt Readings from Last 3 Encounters:  07/27/10 230 lb (104.327 kg)  05/07/10 233 lb (105.688 kg)  02/04/10 236 lb (107.049 kg)      Review of Systems  Skin: Positive for wound.       Objective:   Physical Exam  Constitutional: He appears well-developed and well-nourished. No distress.       Obese. Blood pressure 130/80  Skin:       Small superficial clean laceration tip of the right          Assessment & Plan:  Laceration right thumb. We'll continue local wound care Diabetes mellitus. We'll check a hemoglobin A1c if this is improved we'll continue present regimen. If unimproved we'll initiate insulin therapy Hypertension stable

## 2010-07-27 NOTE — Patient Instructions (Signed)
Limit your sodium (Salt) intake   Please check your hemoglobin A1c every 3 months    It is important that you exercise regularly, at least 20 minutes 3 to 4 times per week.  If you develop chest pain or shortness of breath seek  medical attention.   

## 2010-07-28 NOTE — Progress Notes (Signed)
Quick Note:  Attempt to call- VM - LMTCB need to discuss lab and next step. KIK ______

## 2010-07-29 NOTE — Progress Notes (Signed)
Quick Note:  Attempt to call - VM again, LMTCB ,need appt next week r/t elvation in A1c per dr. Amador Cunas. ______

## 2010-08-02 NOTE — Progress Notes (Signed)
Quick Note:  Attempt to return pt call to cell# - LMTCB needs to make appt to be seen to f/u on labs and meications. Needs to be seen this week or MON/TUES of next week. KIK ______

## 2010-08-06 ENCOUNTER — Ambulatory Visit: Payer: 59 | Admitting: Internal Medicine

## 2010-09-15 ENCOUNTER — Other Ambulatory Visit: Payer: Self-pay | Admitting: Internal Medicine

## 2010-10-26 ENCOUNTER — Encounter: Payer: Self-pay | Admitting: Internal Medicine

## 2010-10-26 ENCOUNTER — Ambulatory Visit (INDEPENDENT_AMBULATORY_CARE_PROVIDER_SITE_OTHER): Payer: 59 | Admitting: Internal Medicine

## 2010-10-26 DIAGNOSIS — I10 Essential (primary) hypertension: Secondary | ICD-10-CM

## 2010-10-26 DIAGNOSIS — F411 Generalized anxiety disorder: Secondary | ICD-10-CM

## 2010-10-26 DIAGNOSIS — E119 Type 2 diabetes mellitus without complications: Secondary | ICD-10-CM

## 2010-10-26 MED ORDER — INSULIN GLARGINE 100 UNIT/ML ~~LOC~~ SOLN: SUBCUTANEOUS | Status: DC

## 2010-10-26 MED ORDER — LORAZEPAM 0.5 MG PO TABS: 1.0000 mg | ORAL_TABLET | Freq: Two times a day (BID) | ORAL | Status: DC | PRN

## 2010-10-26 MED ORDER — INSULIN PEN NEEDLE 32G X 4 MM MISC: 1.0000 | Freq: Every day | Status: DC

## 2010-10-26 NOTE — Progress Notes (Signed)
  Subjective:    Patient ID: Jonathan Gross, male    DOB: 1965-07-11, 45 y.o.   MRN: [id number]  HPI 45 year old patient   who is seen today for followup of his diabetes. He discontinued Victoza a few days ago due to chronic diarrhea. This was on the submaximal dose. His hemoglobin A1c is having greater than 8 for some time. His weight is unchanged at 233. He has treated hypertension and anxiety. He remains under considerable situational stress at work. He is requiring lorazepam daily   Review of Systems  Constitutional: Negative for fever, chills, appetite change and fatigue.  HENT: Negative for hearing loss, ear pain, congestion, sore throat, trouble swallowing, neck stiffness, dental problem, voice change and tinnitus.   Eyes: Negative for pain, discharge and visual disturbance.  Respiratory: Negative for cough, chest tightness, wheezing and stridor.   Cardiovascular: Negative for chest pain, palpitations and leg swelling.  Gastrointestinal: Negative for nausea, vomiting, abdominal pain, diarrhea, constipation, blood in stool and abdominal distention.  Genitourinary: Negative for urgency, hematuria, flank pain, discharge, difficulty urinating and genital sores.  Musculoskeletal: Negative for myalgias, back pain, joint swelling, arthralgias and gait problem.  Skin: Negative for rash.  Neurological: Negative for dizziness, syncope, speech difficulty, weakness, numbness and headaches.  Hematological: Negative for adenopathy. Does not bruise/bleed easily.  Psychiatric/Behavioral: Negative for behavioral problems and dysphoric mood. The patient is nervous/anxious.        Objective:   Physical Exam  Constitutional: He appears well-developed and well-nourished. No distress.       Blood pressure low normal          Assessment & Plan:  Diabetes mellitus poor control. We'll discontinueVictoza and initiate Lantus insulin therapy. We'll recheck in one month  Hypertension stable Anxiety  disorder. Alprazolam refilled

## 2010-10-26 NOTE — Progress Notes (Signed)
  Subjective:    Patient ID: Jonathan Gross, male    DOB: [birth year], 45 y.o.   MRN: [id number]  HPI   Wt Readings from Last 3 Encounters:  10/26/10 233 lb (105.688 kg)  07/27/10 230 lb (104.327 kg)  05/07/10 233 lb (105.688 kg)     Review of Systems     Objective:   Physical Exam        Assessment & Plan:

## 2010-10-26 NOTE — Patient Instructions (Signed)
Limit your sodium (Salt) intake    It is important that you exercise regularly, at least 20 minutes 3 to 4 times per week.  If you develop chest pain or shortness of breath seek  medical attention.  You need to lose weight.  Consider a lower calorie diet and regular exercise.  Increase Lantus to 24 units if blood sugar remains greater than 150. May increase to a final dose of 28 if blood sugar remains greater than 150 discontinue glyburide after present supply. Continue metformin

## 2010-11-23 ENCOUNTER — Ambulatory Visit (INDEPENDENT_AMBULATORY_CARE_PROVIDER_SITE_OTHER): Payer: 59 | Admitting: Internal Medicine

## 2010-11-23 ENCOUNTER — Encounter: Payer: Self-pay | Admitting: Internal Medicine

## 2010-11-23 DIAGNOSIS — E119 Type 2 diabetes mellitus without complications: Secondary | ICD-10-CM

## 2010-11-23 DIAGNOSIS — I10 Essential (primary) hypertension: Secondary | ICD-10-CM

## 2010-11-23 LAB — HM DIABETES FOOT EXAM

## 2010-11-23 MED ORDER — INSULIN GLARGINE 100 UNIT/ML ~~LOC~~ SOLN: SUBCUTANEOUS | Status: DC

## 2010-11-23 NOTE — Patient Instructions (Addendum)
It is important that you exercise regularly, at least 20 minutes 3 to 4 times per week.  If you develop chest pain or shortness of breath seek  medical attention.  You need to lose weight.  Consider a lower calorie diet and regular exercise.   Please check your hemoglobin A1c every 3 months   and her temperature of 1

## 2010-11-23 NOTE — Progress Notes (Signed)
  Subjective:    Patient ID: Jonathan Gross, male    DOB: 01-06-1966, 45 y.o.   MRN: [id number]  HPI20 -year-old patient who was seen today for followup of his diabetes.  Victoza has been discontinued and he is now on Lantus therapy. His present dose has been up titrated to 28 units. Fasting blood sugars are still in excess of 200. He has noted increase in appetite since resuming Lantus and discontinuation of his GLP 1 agonist. Generally feels well. History of hypertension which has been well controlled    Review of Systems  Constitutional: Positive for appetite change.       Objective:   Physical Exam  Constitutional: He appears well-developed and well-nourished. No distress.       Weight 239 Blood pressure 114/80          Assessment & Plan:    Diabetes mellitus. We'll discontinue the Amaryl. We'll continue to titrate Lantus insulin with a final dose of 36 units. Will recheck in 2 months lifestyle issues addressed hypertension stabLE  Recheck 2 months

## 2010-12-20 ENCOUNTER — Other Ambulatory Visit: Payer: Self-pay | Admitting: Internal Medicine

## 2010-12-30 ENCOUNTER — Telehealth: Payer: Self-pay | Admitting: Internal Medicine

## 2010-12-30 NOTE — Telephone Encounter (Signed)
Pt is requesting 90 day supply of lorazepam

## 2010-12-30 NOTE — Telephone Encounter (Signed)
Patient is requesting referral due to uncontrolled diabetes. It was requested he return here in July for initiation of insulin therapy.  Notify patient that we can certainly initiate insulin therapy here and refer to endocrinology if  not well controlled  #90 alprazolam. These office visit here prior to any further refills

## 2010-12-30 NOTE — Telephone Encounter (Signed)
Please advise on referral

## 2010-12-30 NOTE — Telephone Encounter (Signed)
Pt would like to be referred to Endocrinology Debara Pickett) pt also would like a refill on  LORazepam (ATIVAN) 0.5 MG tablet   [facility] [address]

## 2010-12-30 NOTE — Telephone Encounter (Signed)
Ok

## 2010-12-30 NOTE — Telephone Encounter (Signed)
What is the reason for referral?

## 2010-12-31 MED ORDER — LORAZEPAM 0.5 MG PO TABS: 1.0000 mg | ORAL_TABLET | Freq: Two times a day (BID) | ORAL | Status: DC | PRN

## 2010-12-31 NOTE — Telephone Encounter (Signed)
Called in med - order put in for referral to endocrinology.

## 2011-01-26 ENCOUNTER — Ambulatory Visit: Payer: 59 | Admitting: Internal Medicine

## 2011-01-28 ENCOUNTER — Other Ambulatory Visit: Payer: Self-pay

## 2011-01-28 NOTE — Telephone Encounter (Signed)
Refill request for lorazopam - last time filled in dec. Per dr. Amador Cunas instruction were to be seen for future refills

## 2011-01-31 ENCOUNTER — Encounter: Payer: Self-pay | Admitting: Internal Medicine

## 2011-01-31 ENCOUNTER — Other Ambulatory Visit: Payer: Self-pay | Admitting: Internal Medicine

## 2011-01-31 ENCOUNTER — Ambulatory Visit (INDEPENDENT_AMBULATORY_CARE_PROVIDER_SITE_OTHER): Payer: 59 | Admitting: Internal Medicine

## 2011-01-31 DIAGNOSIS — F411 Generalized anxiety disorder: Secondary | ICD-10-CM

## 2011-01-31 DIAGNOSIS — I10 Essential (primary) hypertension: Secondary | ICD-10-CM

## 2011-01-31 DIAGNOSIS — E119 Type 2 diabetes mellitus without complications: Secondary | ICD-10-CM

## 2011-01-31 MED ORDER — LORAZEPAM 0.5 MG PO TABS: 1.0000 mg | ORAL_TABLET | Freq: Two times a day (BID) | ORAL | Status: DC | PRN

## 2011-01-31 MED ORDER — INSULIN GLARGINE 100 UNIT/ML ~~LOC~~ SOLN: SUBCUTANEOUS | Status: DC

## 2011-01-31 NOTE — Telephone Encounter (Signed)
Pt is requesting refill today.

## 2011-01-31 NOTE — Progress Notes (Signed)
  Subjective:    Patient ID: Jonathan Gross, male    DOB: [birth year], 46 y.o.   MRN: [id number]  HPI  46 year old patient who is seen today for followup of hypertension and diabetes. He has an appointment with endocrinology in approximately 3 weeks. Diabetes has been poorly controlled. His last hemoglobin A1c was in July and was in excess of 9. Victoza  has been tapered and discontinued and presently he is on Lantus 45 units at bedtime. He was that if that if his hemoglobin A1c was not at goal we may add mealtime short acting insulin. He misunderstood these directions/suggestions  and occasionally takes extra Lantus prior to his evening meal. He has had a eye exam within the past year. Lifestyle issues improved more recently  Wt Readings from Last 3 Encounters:  01/31/11 238 lb (107.956 kg)  11/23/10 239 lb (108.41 kg)  10/26/10 233 lb (105.688 kg)    His blood pressure has been well-controlled    Review of Systems  Constitutional: Negative for fever, chills, appetite change and fatigue.  HENT: Negative for hearing loss, ear pain, congestion, sore throat, trouble swallowing, neck stiffness, dental problem, voice change and tinnitus.   Eyes: Negative for pain, discharge and visual disturbance.  Respiratory: Negative for cough, chest tightness, wheezing and stridor.   Cardiovascular: Negative for chest pain, palpitations and leg swelling.  Gastrointestinal: Negative for nausea, vomiting, abdominal pain, diarrhea, constipation, blood in stool and abdominal distention.  Genitourinary: Negative for urgency, hematuria, flank pain, discharge, difficulty urinating and genital sores.  Musculoskeletal: Negative for myalgias, back pain, joint swelling, arthralgias and gait problem.  Skin: Negative for rash.  Neurological: Negative for dizziness, syncope, speech difficulty, weakness, numbness and headaches.  Hematological: Negative for adenopathy. Does not bruise/bleed easily.  Psychiatric/Behavioral:  Negative for behavioral problems and dysphoric mood. The patient is not nervous/anxious.        Objective:   Physical Exam  Constitutional: He is oriented to person, place, and time. He appears well-developed.  HENT:  Head: Normocephalic.  Right Ear: External ear normal.  Left Ear: External ear normal.  Eyes: Conjunctivae and EOM are normal.  Neck: Normal range of motion.  Cardiovascular: Normal rate and normal heart sounds.   Pulmonary/Chest: Breath sounds normal.  Abdominal: Bowel sounds are normal.  Musculoskeletal: Normal range of motion. He exhibits no edema and no tenderness.  Neurological: He is alert and oriented to person, place, and time.  Psychiatric: He has a normal mood and affect. His behavior is normal.          Assessment & Plan:  Diabetes mellitus. Followup in endocrinology in 3 weeks as scheduled. Will likely benefit from the mealtime short acting insulin. We'll continue on 45 units of Lantus at this time as well as metformin therapy Hypertension. Well controlled we'll continue low-salt diet efforts at diet and weight loss as well as a regular exercise program Exogenous obesity

## 2011-01-31 NOTE — Telephone Encounter (Signed)
#  90 OK 

## 2011-01-31 NOTE — Telephone Encounter (Signed)
Called in.

## 2011-01-31 NOTE — Telephone Encounter (Signed)
Patient was just seen today and did not get a refill Lorazepam sent to [facility]. Thanks.

## 2011-01-31 NOTE — Patient Instructions (Signed)
Please check your hemoglobin A1c every 3 months  Limit your sodium (Salt) intake    It is important that you exercise regularly, at least 20 minutes 3 to 4 times per week.  If you develop chest pain or shortness of breath seek  medical attention.  You need to lose weight.  Consider a lower calorie diet and regular exercise.  Return in 6 months for follow-up  

## 2011-01-31 NOTE — Telephone Encounter (Signed)
You has requested rovbrfore future refills- was seen today - please advise on RF - last filled 12/31/10 # 90 0RF

## 2011-02-28 ENCOUNTER — Other Ambulatory Visit: Payer: Self-pay

## 2011-02-28 ENCOUNTER — Other Ambulatory Visit: Payer: Self-pay | Admitting: Internal Medicine

## 2011-02-28 MED ORDER — LORAZEPAM 0.5 MG PO TABS: 1.0000 mg | ORAL_TABLET | Freq: Two times a day (BID) | ORAL | Status: DC | PRN

## 2011-02-28 NOTE — Telephone Encounter (Signed)
[facility] 0.5mg  -  2tab bid prn Last seen 01/31/11 last written 01/31/11 # 90 0RF Please advise

## 2011-02-28 NOTE — Telephone Encounter (Signed)
[facility]opam [facility]  0.5 mg - 2tab bid prn Last seen 01/22/11  Last written 01/31/11 # 90  0RF Please advise

## 2011-02-28 NOTE — Telephone Encounter (Signed)
90

## 2011-04-21 ENCOUNTER — Other Ambulatory Visit: Payer: Self-pay

## 2011-04-21 MED ORDER — LORAZEPAM 0.5 MG PO TABS: 1.0000 mg | ORAL_TABLET | Freq: Two times a day (BID) | ORAL | Status: DC | PRN

## 2011-06-02 ENCOUNTER — Ambulatory Visit (INDEPENDENT_AMBULATORY_CARE_PROVIDER_SITE_OTHER): Payer: PRIVATE HEALTH INSURANCE | Admitting: Licensed Clinical Social Worker

## 2011-06-02 DIAGNOSIS — F331 Major depressive disorder, recurrent, moderate: Secondary | ICD-10-CM

## 2011-06-09 ENCOUNTER — Ambulatory Visit (INDEPENDENT_AMBULATORY_CARE_PROVIDER_SITE_OTHER): Payer: PRIVATE HEALTH INSURANCE | Admitting: Licensed Clinical Social Worker

## 2011-06-09 DIAGNOSIS — F331 Major depressive disorder, recurrent, moderate: Secondary | ICD-10-CM

## 2011-06-23 ENCOUNTER — Ambulatory Visit (INDEPENDENT_AMBULATORY_CARE_PROVIDER_SITE_OTHER): Payer: PRIVATE HEALTH INSURANCE | Admitting: Licensed Clinical Social Worker

## 2011-06-23 DIAGNOSIS — F331 Major depressive disorder, recurrent, moderate: Secondary | ICD-10-CM

## 2011-07-07 ENCOUNTER — Ambulatory Visit (INDEPENDENT_AMBULATORY_CARE_PROVIDER_SITE_OTHER): Payer: PRIVATE HEALTH INSURANCE | Admitting: Licensed Clinical Social Worker

## 2011-07-07 DIAGNOSIS — F331 Major depressive disorder, recurrent, moderate: Secondary | ICD-10-CM

## 2011-07-15 ENCOUNTER — Ambulatory Visit (INDEPENDENT_AMBULATORY_CARE_PROVIDER_SITE_OTHER): Payer: PRIVATE HEALTH INSURANCE | Admitting: Internal Medicine

## 2011-07-15 ENCOUNTER — Encounter: Payer: Self-pay | Admitting: Internal Medicine

## 2011-07-15 VITALS — BP 116/80 | Temp 98.1°F | Wt 230.0 lb

## 2011-07-15 DIAGNOSIS — IMO0002 Reserved for concepts with insufficient information to code with codable children: Secondary | ICD-10-CM

## 2011-07-15 DIAGNOSIS — S76219A Strain of adductor muscle, fascia and tendon of unspecified thigh, initial encounter: Secondary | ICD-10-CM

## 2011-07-15 DIAGNOSIS — E119 Type 2 diabetes mellitus without complications: Secondary | ICD-10-CM

## 2011-07-15 DIAGNOSIS — I10 Essential (primary) hypertension: Secondary | ICD-10-CM

## 2011-07-15 NOTE — Patient Instructions (Signed)
Please check your hemoglobin A1c every 3 months  Limit your sodium (Salt) intake    It is important that you exercise regularly, at least 20 minutes 3 to 4 times per week.  If you develop chest pain or shortness of breath seek  medical attention.  Call or return to clinic prn if these symptoms worsen or fail to improve as anticipated.

## 2011-07-15 NOTE — Progress Notes (Signed)
  Subjective:    Patient ID: Jonathan Gross, male    DOB: 10/17/1965, 46 y.o.   MRN: [id number]  HPI  46 year old patient who has treated hypertension and who is following endocrinology for type 2 diabetes. His diabetes has improved and hemoglobin A1c he states is closer to 7. He has a chief complaint today of left groin pain of 2 days' duration. This followed what he describes as vigorous sexual activity the night before. He has been using Vicoprofen from his dentist with some improvement.    Review of Systems  Constitutional: Negative for fever, chills, appetite change and fatigue.  HENT: Negative for hearing loss, ear pain, congestion, sore throat, trouble swallowing, neck stiffness, dental problem, voice change and tinnitus.   Eyes: Negative for pain, discharge and visual disturbance.  Respiratory: Negative for cough, chest tightness, wheezing and stridor.   Cardiovascular: Negative for chest pain, palpitations and leg swelling.  Gastrointestinal: Negative for nausea, vomiting, abdominal pain (left groin pain), diarrhea, constipation, blood in stool and abdominal distention.  Genitourinary: Negative for urgency, hematuria, flank pain, discharge, difficulty urinating and genital sores.  Musculoskeletal: Negative for myalgias, back pain, joint swelling, arthralgias and gait problem.  Skin: Negative for rash.  Neurological: Negative for dizziness, syncope, speech difficulty, weakness, numbness and headaches.  Hematological: Negative for adenopathy. Does not bruise/bleed easily.  Psychiatric/Behavioral: Negative for behavioral problems and dysphoric mood. The patient is not nervous/anxious.        Objective:   Physical Exam  Constitutional: He appears well-developed and well-nourished. No distress.       Obese. Blood pressure well controlled  Abdominal: Soft. Bowel sounds are normal. He exhibits no distension. There is no tenderness. There is no rebound.  Genitourinary: Penis normal.   No evidence of hernia The left testicle was mildly tender          Assessment & Plan:  Groin strain Diabetes improved Hypertension stable  Followup endocrinology Continue ibuprofen. Will call if unimproved

## 2011-07-21 ENCOUNTER — Other Ambulatory Visit: Payer: Self-pay | Admitting: Internal Medicine

## 2011-07-21 MED ORDER — LORAZEPAM 0.5 MG PO TABS: 1.0000 mg | ORAL_TABLET | Freq: Two times a day (BID) | ORAL | Status: DC | PRN

## 2011-07-21 NOTE — Telephone Encounter (Signed)
Patient called stating that he need a refill of his lorazepam sent to [facility]. Please assist.

## 2011-07-21 NOTE — Telephone Encounter (Signed)
Called in.

## 2011-07-25 ENCOUNTER — Ambulatory Visit: Payer: 59 | Admitting: Internal Medicine

## 2011-08-08 ENCOUNTER — Ambulatory Visit: Payer: 59 | Admitting: Internal Medicine

## 2011-08-10 ENCOUNTER — Ambulatory Visit (INDEPENDENT_AMBULATORY_CARE_PROVIDER_SITE_OTHER): Payer: PRIVATE HEALTH INSURANCE | Admitting: Licensed Clinical Social Worker

## 2011-08-10 DIAGNOSIS — F331 Major depressive disorder, recurrent, moderate: Secondary | ICD-10-CM

## 2011-08-31 ENCOUNTER — Ambulatory Visit (INDEPENDENT_AMBULATORY_CARE_PROVIDER_SITE_OTHER): Payer: PRIVATE HEALTH INSURANCE | Admitting: Licensed Clinical Social Worker

## 2011-08-31 DIAGNOSIS — F331 Major depressive disorder, recurrent, moderate: Secondary | ICD-10-CM

## 2011-09-15 ENCOUNTER — Telehealth: Payer: Self-pay | Admitting: Internal Medicine

## 2011-09-15 NOTE — Telephone Encounter (Signed)
Caller: [name]/Patient; Patient Name: Jonathan Gross; PCP: Eleonore Chiquito [facility]); Best Callback Phone Number: [phone]. Started with rash on feet after taking anitbiotic on 08/17/11 for infection in tooth. Rash on feet went away but he continued to get rashes that would come and go in other places on body. Skin becomes itchy but no redness until he scratches area- then small pink bumps. He has been under alot of stress lately. He has tried topical creams and not helping. He is taking Benedry  25 mgs 2 tabs PO every 4 hours-helps some. No changes in diet or medications. Takes Lorazepam for anxiety. Triager and Care advice per Hives Protocol and appnt advised within 24 hours. He is going to try taking Zyrtec once daily and Benedry 25 Mgs 2 tabs PO at night and introduce Probiotic daily to see if helps. Advised to drink plenty of water. He will call back for appnt on 09/19/11 if needed for appnt.

## 2011-09-16 ENCOUNTER — Encounter: Payer: Self-pay | Admitting: Internal Medicine

## 2011-09-16 ENCOUNTER — Ambulatory Visit (INDEPENDENT_AMBULATORY_CARE_PROVIDER_SITE_OTHER): Payer: PRIVATE HEALTH INSURANCE | Admitting: Internal Medicine

## 2011-09-16 VITALS — BP 116/76 | Temp 98.0°F | Wt 228.0 lb

## 2011-09-16 DIAGNOSIS — E119 Type 2 diabetes mellitus without complications: Secondary | ICD-10-CM

## 2011-09-16 DIAGNOSIS — I10 Essential (primary) hypertension: Secondary | ICD-10-CM

## 2011-09-16 DIAGNOSIS — L309 Dermatitis, unspecified: Secondary | ICD-10-CM

## 2011-09-16 DIAGNOSIS — F411 Generalized anxiety disorder: Secondary | ICD-10-CM

## 2011-09-16 DIAGNOSIS — L259 Unspecified contact dermatitis, unspecified cause: Secondary | ICD-10-CM

## 2011-09-16 MED ORDER — LORAZEPAM 0.5 MG PO TABS: 1.0000 mg | ORAL_TABLET | Freq: Two times a day (BID) | ORAL | Status: DC | PRN

## 2011-09-16 MED ORDER — METHYLPREDNISOLONE ACETATE 40 MG/ML IJ SUSP
40.0000 mg | Freq: Once | INTRAMUSCULAR | Status: AC
  Administered 2011-09-16: 40 mg via INTRAMUSCULAR

## 2011-09-16 NOTE — Patient Instructions (Signed)
Please check your hemoglobin A1c every 3 months  Call or return to clinic prn if these symptoms worsen or fail to improve as anticipated.    It is important that you exercise regularly, at least 20 minutes 3 to 4 times per week.  If you develop chest pain or shortness of breath seek  medical attention.  You need to lose weight.  Consider a lower calorie diet and regular exercise.  Return in 6 months for follow-up

## 2011-09-16 NOTE — Progress Notes (Signed)
  Subjective:    Patient ID: Jonathan Gross, male    DOB: 1965/07/08, 45 y.o.   MRN: [id number]  HPI  46 year old patient who is seen today for followup. He presents with a chief complaint of a rash involving primarily his upper back region. This began after completing several days of amoxicillin from his dentist. He also wonders if it's not related to situational stress. His son recently broke his leg requiring surgery. He is followed by endocrinology for his insulin requiring diabetes  Wt Readings from Last 3 Encounters:  09/16/11 228 lb (103.42 kg)  07/15/11 230 lb (104.327 kg)  01/31/11 238 lb (107.956 kg)    Review of Systems  Constitutional: Negative for fever, chills, appetite change and fatigue.  HENT: Negative for hearing loss, ear pain, congestion, sore throat, trouble swallowing, neck stiffness, dental problem, voice change and tinnitus.   Eyes: Negative for pain, discharge and visual disturbance.  Respiratory: Negative for cough, chest tightness, wheezing and stridor.   Cardiovascular: Negative for chest pain, palpitations and leg swelling.  Gastrointestinal: Negative for nausea, vomiting, abdominal pain, diarrhea, constipation, blood in stool and abdominal distention.  Genitourinary: Negative for urgency, hematuria, flank pain, discharge, difficulty urinating and genital sores.  Musculoskeletal: Negative for myalgias, back pain, joint swelling, arthralgias and gait problem.  Skin: Positive for rash.  Neurological: Negative for dizziness, syncope, speech difficulty, weakness, numbness and headaches.  Hematological: Negative for adenopathy. Does not bruise/bleed easily.  Psychiatric/Behavioral: Negative for behavioral problems and dysphoric mood. The patient is not nervous/anxious.        Objective:   Physical Exam  Constitutional: He is oriented to person, place, and time. He appears well-developed.  HENT:  Head: Normocephalic.  Right Ear: External ear normal.  Left Ear:  External ear normal.  Eyes: Conjunctivae and EOM are normal.  Neck: Normal range of motion.  Cardiovascular: Normal rate and normal heart sounds.   Pulmonary/Chest: Breath sounds normal.  Abdominal: Bowel sounds are normal.  Musculoskeletal: Normal range of motion. He exhibits no edema and no tenderness.  Neurological: He is alert and oriented to person, place, and time.  Skin: Rash noted.       Erythematous maculopapular rash involving his upper back and flank areas  Psychiatric: He has a normal mood and affect. His behavior is normal.          Assessment & Plan:   Dermatitis. Possible delayed reaction to amoxicillin. We'll continue Zyrtec and treat with modest dose Depo-Medrol 40 mg Hypertension stable Diabetes mellitus. Per endocrinology. Medicines updated  Recheck 6 months

## 2011-09-19 ENCOUNTER — Telehealth: Payer: Self-pay | Admitting: Internal Medicine

## 2011-09-19 MED ORDER — PREDNISONE (PAK) 5 MG PO TABS: ORAL_TABLET | ORAL | Status: DC

## 2011-09-19 MED ORDER — HYDROXYZINE HCL 25 MG PO TABS: 25.0000 mg | ORAL_TABLET | Freq: Four times a day (QID) | ORAL | Status: AC | PRN

## 2011-09-19 NOTE — Telephone Encounter (Signed)
Spoke with pt- informed of meds to be started - sent to [facility]

## 2011-09-19 NOTE — Telephone Encounter (Signed)
Prednisone Dosepak 5 mg 6 day Atarax 25 mg #40 one every 6 hours as needed Ask patient  to obtain OTC Zantac and take 1 twice daily

## 2011-09-19 NOTE — Telephone Encounter (Signed)
Caller: Jonathan Gross/Patient; Patient Name: Jonathan Gross; PCP: Eleonore Chiquito [facility]); Best Callback Phone Number: [phone]; Reason for call: Rash/Hives Pt was in on Friday 09/16/11 for hives. Pt had a steroid injection. Pt is also taken Benadryl and zyrtec and otc HC cream and oatmeal baths. Pt has not seen any improvement. Rn triaged per hives. Is there something that MD can prescribe that will help with itching better than otc Benadryl/Zyrtec. Pt did not expect hives to last this long. He was instructed the steroid injection would provide relief.  [facility] [facility] [address] OFFICE PLEASE CALL PT BACK.

## 2011-09-19 NOTE — Telephone Encounter (Signed)
Please advise 

## 2011-09-28 ENCOUNTER — Ambulatory Visit (INDEPENDENT_AMBULATORY_CARE_PROVIDER_SITE_OTHER): Payer: PRIVATE HEALTH INSURANCE | Admitting: Licensed Clinical Social Worker

## 2011-09-28 DIAGNOSIS — F331 Major depressive disorder, recurrent, moderate: Secondary | ICD-10-CM

## 2011-10-05 ENCOUNTER — Other Ambulatory Visit: Payer: Self-pay | Admitting: Internal Medicine

## 2011-10-22 ENCOUNTER — Other Ambulatory Visit: Payer: Self-pay | Admitting: Internal Medicine

## 2011-11-23 ENCOUNTER — Ambulatory Visit (INDEPENDENT_AMBULATORY_CARE_PROVIDER_SITE_OTHER): Payer: PRIVATE HEALTH INSURANCE | Admitting: Licensed Clinical Social Worker

## 2011-11-23 DIAGNOSIS — F331 Major depressive disorder, recurrent, moderate: Secondary | ICD-10-CM

## 2011-12-01 ENCOUNTER — Ambulatory Visit (INDEPENDENT_AMBULATORY_CARE_PROVIDER_SITE_OTHER): Payer: PRIVATE HEALTH INSURANCE | Admitting: Licensed Clinical Social Worker

## 2011-12-01 DIAGNOSIS — F331 Major depressive disorder, recurrent, moderate: Secondary | ICD-10-CM

## 2011-12-15 ENCOUNTER — Ambulatory Visit (INDEPENDENT_AMBULATORY_CARE_PROVIDER_SITE_OTHER): Payer: PRIVATE HEALTH INSURANCE | Admitting: Licensed Clinical Social Worker

## 2011-12-15 DIAGNOSIS — F331 Major depressive disorder, recurrent, moderate: Secondary | ICD-10-CM

## 2012-08-10 ENCOUNTER — Ambulatory Visit (INDEPENDENT_AMBULATORY_CARE_PROVIDER_SITE_OTHER): Payer: 59 | Admitting: Internal Medicine

## 2012-08-10 ENCOUNTER — Encounter: Payer: Self-pay | Admitting: Internal Medicine

## 2012-08-10 VITALS — BP 140/90 | HR 97 | Temp 98.7°F | Resp 20 | Wt 222.0 lb

## 2012-08-10 DIAGNOSIS — M542 Cervicalgia: Secondary | ICD-10-CM

## 2012-08-10 DIAGNOSIS — E119 Type 2 diabetes mellitus without complications: Secondary | ICD-10-CM

## 2012-08-10 DIAGNOSIS — I10 Essential (primary) hypertension: Secondary | ICD-10-CM

## 2012-08-10 MED ORDER — TRAMADOL HCL 50 MG PO TABS: 50.0000 mg | ORAL_TABLET | Freq: Three times a day (TID) | ORAL | Status: DC | PRN

## 2012-08-10 NOTE — Progress Notes (Signed)
Subjective:    Patient ID: Jonathan Gross, male    DOB: [birth year], 47 y.o.   MRN: [id number]  HPI   47 year old patient who is seen today for followup he has a history of diabetes hypertension and anxiety disorder. He is on lorazepam 3 times a day. He presents with a three-day history of posterior neck pain which is aggravated by movement. He states that he has been under more situational stress. No fever or other constitutional complaints. He has been using Tylenol and anti-inflammatory  medications with some improvement.  Past Medical History  Diagnosis Date  . ANXIETY 08/05/2008  . DIABETES MELLITUS, TYPE II 09/08/2006  . GERD 09/08/2006  . HYPERTENSION 09/08/2006  . PANIC DISORDER, HX OF 10/30/2006    History   Social History  . Marital Status: Married    Spouse Name: N/A    Number of Children: N/A  . Years of Education: N/A   Occupational History  . Not on file.   Social History Main Topics  . Smoking status: Former Smoker    Quit date: 01/10/1998  . Smokeless tobacco: Never Used  . Alcohol Use: No  . Drug Use: Not on file  . Sexually Active: Not on file   Other Topics Concern  . Not on file   Social History Narrative  . No narrative on file    Past Surgical History  Procedure Laterality Date  . Appendectomy    . Vasectomy    . Toe amputation      Family History  Problem Relation Age of Onset  . Diabetes Mother   . Hypertension Sister   . Hyperlipidemia Sister   . Heart disease Sister     cabg age 62    Allergies  Allergen Reactions  . Amoxicillin Rash    Current Outpatient Prescriptions on File Prior to Visit  Medication Sig Dispense Refill  . atorvastatin (LIPITOR) 10 MG tablet Take 10 mg by mouth daily.       . insulin glargine (LANTUS) 100 UNIT/ML injection 60 units at bedtime      . Insulin Pen Needle (BD PEN NEEDLE NANO U/F) 32G X 4 MM MISC 1 each by Does not apply route daily.  100 each  3  . lisinopril (PRINIVIL,ZESTRIL) 20 MG tablet Take  20 mg by mouth daily.      Marland Kitchen LORazepam (ATIVAN) 0.5 MG tablet Take 2 tablets (1 mg total) by mouth 2 (two) times daily as needed.  90 tablet  3  . metFORMIN (GLUCOPHAGE) 1000 MG tablet TAKE 1 TABLET TWICE A DAY  180 tablet  3  . VICTOZA 18 MG/3ML SOLN        No current facility-administered medications on file prior to visit.    BP 140/90  Pulse 97  Temp(Src) 98.7 F (37.1 C) (Oral)  Resp 20  Wt 222 lb (100.699 kg)  BMI 34.76 kg/m2  SpO2 98%       Review of Systems  Constitutional: Negative for fever, chills, appetite change and fatigue.  HENT: Negative for hearing loss, ear pain, congestion, sore throat, trouble swallowing, neck stiffness, dental problem, voice change and tinnitus.   Eyes: Negative for pain, discharge and visual disturbance.  Respiratory: Negative for cough, chest tightness, wheezing and stridor.   Cardiovascular: Negative for chest pain, palpitations and leg swelling.  Gastrointestinal: Negative for nausea, vomiting, abdominal pain, diarrhea, constipation, blood in stool and abdominal distention.  Genitourinary: Negative for urgency, hematuria, flank pain, discharge, difficulty urinating and genital  sores.  Musculoskeletal: Positive for back pain. Negative for myalgias, joint swelling, arthralgias and gait problem.       Three-day history of posterior neck pain  Skin: Negative for rash.  Neurological: Negative for dizziness, syncope, speech difficulty, weakness, numbness and headaches.  Hematological: Negative for adenopathy. Does not bruise/bleed easily.  Psychiatric/Behavioral: Negative for behavioral problems and dysphoric mood. The patient is not nervous/anxious.        Objective:   Physical Exam  Constitutional: He appears well-developed and well-nourished.  HENT:  Oropharynx clear  Neck: Normal range of motion. Neck supple.  Full range of motion but somewhat uncomfortable with head turning to the left and right. No meningismus  Lymphadenopathy:     He has no cervical adenopathy.          Assessment & Plan:   Neck pain. Musculoligamentous. Will continue anti-inflammatories and lorazepam. Will treat with short-term tramadol

## 2012-08-10 NOTE — Patient Instructions (Signed)
Call or return to clinic prn if these symptoms worsen or fail to improve as anticipated. Cervical Sprain A cervical sprain is an injury in the neck in which the ligaments are stretched or torn. The ligaments are the tissues that hold the bones of the neck (vertebrae) in place.Cervical sprains can range from very mild to very severe. Most cervical sprains get better in 1 to 3 weeks, but it depends on the cause and extent of the injury. Severe cervical sprains can cause the neck vertebrae to be unstable. This can lead to damage of the spinal cord and can result in serious nervous system problems. Your caregiver will determine whether your cervical sprain is mild or severe. CAUSES  Severe cervical sprains may be caused by:  Contact sport injuries (football, rugby, wrestling, hockey, auto racing, gymnastics, diving, martial arts, boxing).  Motor vehicle collisions.  Whiplash injuries. This means the neck is forcefully whipped backward and forward.  Falls. Mild cervical sprains may be caused by:   Awkward positions, such as cradling a telephone between your ear and shoulder.  Sitting in a chair that does not offer proper support.  Working at a poorly Marketing executive station.  Activities that require looking up or down for long periods of time. SYMPTOMS   Pain, soreness, stiffness, or a burning sensation in the front, back, or sides of the neck. This discomfort may develop immediately after injury or it may develop slowly and not begin for 24 hours or more after an injury.  Pain or tenderness directly in the middle of the back of the neck.  Shoulder or upper back pain.  Limited ability to move the neck.  Headache.  Dizziness.  Weakness, numbness, or tingling in the hands or arms.  Muscle spasms.  Difficulty swallowing or chewing.  Tenderness and swelling of the neck. DIAGNOSIS  Most of the time, your caregiver can diagnose this problem by taking your history and doing a  physical exam. Your caregiver will ask about any known problems, such as arthritis in the neck or a previous neck injury. X-rays may be taken to find out if there are any other problems, such as problems with the bones of the neck. However, an X-ray often does not reveal the full extent of a cervical sprain. Other tests such as a computed tomography (CT) scan or magnetic resonance imaging (MRI) may be needed. TREATMENT  Treatment depends on the severity of the cervical sprain. Mild sprains can be treated with rest, keeping the neck in place (immobilization), and pain medicines. Severe cervical sprains need immediate immobilization and an appointment with an orthopedist or neurosurgeon. Several treatment options are available to help with pain, muscle spasms, and other symptoms. Your caregiver may prescribe:  Medicines, such as pain relievers, numbing medicines, or muscle relaxants.  Physical therapy. This can include stretching exercises, strengthening exercises, and posture training. Exercises and improved posture can help stabilize the neck, strengthen muscles, and help stop symptoms from returning.  A neck collar to be worn for short periods of time. Often, these collars are worn for comfort. However, certain collars may be worn to protect the neck and prevent further worsening of a serious cervical sprain. HOME CARE INSTRUCTIONS   Put ice on the injured area.  Put ice in a plastic bag.  Place a towel between your skin and the bag.  Leave the ice on for 15-20 minutes, 3-4 times a day.  Only take over-the-counter or prescription medicines for pain, discomfort, or fever as directed  by your caregiver.  Keep all follow-up appointments as directed by your caregiver.  Keep all physical therapy appointments as directed by your caregiver.  If a neck collar is prescribed, wear it as directed by your caregiver.  Do not drive while wearing a neck collar.  Make any needed adjustments to your work  station to promote good posture.  Avoid positions and activities that make your symptoms worse.  Warm up and stretch before being active to help prevent problems. SEEK MEDICAL CARE IF:   Your pain is not controlled with medicine.  You are unable to decrease your pain medicine over time as planned.  Your activity level is not improving as expected. SEEK IMMEDIATE MEDICAL CARE IF:   You develop any bleeding, stomach upset, or signs of an allergic reaction to your medicine.  Your symptoms get worse.  You develop new, unexplained symptoms.  You have numbness, tingling, weakness, or paralysis in any part of your body. MAKE SURE YOU:   Understand these instructions.  Will watch your condition.  Will get help right away if you are not doing well or get worse. Document Released: 10/24/2006 Document Revised: 03/21/2011 Document Reviewed: 09/29/2010 [facility] Patient Information 2014 [place], [place].

## 2012-11-02 ENCOUNTER — Other Ambulatory Visit: Payer: Self-pay | Admitting: Internal Medicine

## 2013-01-09 ENCOUNTER — Ambulatory Visit (INDEPENDENT_AMBULATORY_CARE_PROVIDER_SITE_OTHER): Payer: 59 | Admitting: Internal Medicine

## 2013-01-09 ENCOUNTER — Encounter: Payer: Self-pay | Admitting: Internal Medicine

## 2013-01-09 VITALS — BP 122/84 | Temp 98.2°F | Wt 221.0 lb

## 2013-01-09 DIAGNOSIS — R5381 Other malaise: Secondary | ICD-10-CM

## 2013-01-09 DIAGNOSIS — J40 Bronchitis, not specified as acute or chronic: Secondary | ICD-10-CM

## 2013-01-09 DIAGNOSIS — R05 Cough: Secondary | ICD-10-CM

## 2013-01-09 MED ORDER — DOXYCYCLINE HYCLATE 100 MG PO TABS: 100.0000 mg | ORAL_TABLET | Freq: Two times a day (BID) | ORAL | Status: DC

## 2013-01-09 NOTE — Progress Notes (Signed)
Subjective:    Patient ID: Jonathan Gross, male    DOB: 10-May-1965, 47 y.o.   MRN: [id number]  HPI  47 year old white male with history of type 2 diabetes, hyperlipidemia and mood disorder complains of flu like symptoms starting on Sunday. He reports he experienced a scratchy throat on Sunday then symptoms progressed to nonproductive cough. On Monday he started to feel achy all over. His sputum has been slightly pink over the last 2 days.  Review of Systems Negative for fever or shortness of breath  Past Medical History  Diagnosis Date  . ANXIETY 08/05/2008  . DIABETES MELLITUS, TYPE II 09/08/2006  . GERD 09/08/2006  . HYPERTENSION 09/08/2006  . PANIC DISORDER, HX OF 10/30/2006    History   Social History  . Marital Status: Married    Spouse Name: N/A    Number of Children: N/A  . Years of Education: N/A   Occupational History  . Not on file.   Social History Main Topics  . Smoking status: Former Smoker    Quit date: 01/10/1998  . Smokeless tobacco: Never Used  . Alcohol Use: No  . Drug Use: Not on file  . Sexual Activity: Not on file   Other Topics Concern  . Not on file   Social History Narrative  . No narrative on file    Past Surgical History  Procedure Laterality Date  . Appendectomy    . Vasectomy    . Toe amputation      Family History  Problem Relation Age of Onset  . Diabetes Mother   . Hypertension Sister   . Hyperlipidemia Sister   . Heart disease Sister     cabg age 41    Allergies  Allergen Reactions  . Amoxicillin Rash    Current Outpatient Prescriptions on File Prior to Visit  Medication Sig Dispense Refill  . atorvastatin (LIPITOR) 10 MG tablet Take 10 mg by mouth daily.       . Canagliflozin (INVOKANA) 300 MG TABS Take 1 tablet by mouth daily before breakfast.      . insulin glargine (LANTUS) 100 UNIT/ML injection 60 units at bedtime      . Insulin Pen Needle (BD PEN NEEDLE NANO U/F) 32G X 4 MM MISC 1 each by Does not apply route  daily.  100 each  3  . lamoTRIgine (LAMICTAL) 100 MG tablet Take 100 mg by mouth daily.      Marland Kitchen lamoTRIgine (LAMICTAL) 200 MG tablet Take 200 mg by mouth daily.      Marland Kitchen lisinopril (PRINIVIL,ZESTRIL) 20 MG tablet Take 20 mg by mouth daily.      Marland Kitchen LORazepam (ATIVAN) 0.5 MG tablet Take 2 tablets (1 mg total) by mouth 2 (two) times daily as needed.  90 tablet  3  . metFORMIN (GLUCOPHAGE) 1000 MG tablet TAKE 1 TABLET TWICE A DAY  180 tablet  3   No current facility-administered medications on file prior to visit.    BP 122/84  Temp(Src) 98.2 F (36.8 C) (Oral)  Wt 221 lb (100.245 kg)       Objective:   Physical Exam  Constitutional: He appears well-developed and well-nourished. No distress.  HENT:  Head: Normocephalic and atraumatic.  Right Ear: External ear normal.  Left Ear: External ear normal.  Mild oropharyngeal erythema  Neck: Neck supple.  No neck tenderness  Cardiovascular: Normal rate, regular rhythm and normal heart sounds.   No murmur heard. Pulmonary/Chest: Effort normal and breath sounds normal.  He has no wheezes. He has no rales.  Musculoskeletal: He exhibits no edema.  Skin: Skin is warm and dry.  Psychiatric: He has a normal mood and affect. His behavior is normal.          Assessment & Plan:

## 2013-01-09 NOTE — Patient Instructions (Signed)
Please contact our office if your symptoms do not improve or gets worse.  

## 2013-01-09 NOTE — Assessment & Plan Note (Addendum)
47 year old white male with history of type 2 diabetes presents with mild bronchitis. He has slight coarse breath sounds in right mid lung field and right lower lung field. Treat with doxycycline 100 mg twice daily. Patient reports possible pinkish (blood tinged) sputum. Arrange chest x-ray before followup with his primary care physician.  Patient advised to call office if symptoms persist or worsen.

## 2013-01-09 NOTE — Progress Notes (Signed)
Pre visit review using our clinic review tool, if applicable. No additional management support is needed unless otherwise documented below in the visit note. 

## 2013-01-11 ENCOUNTER — Telehealth: Payer: Self-pay | Admitting: *Deleted

## 2013-01-11 NOTE — Telephone Encounter (Signed)
Message copied by Marian Sorrow on Fri Jan 11, 2013 12:23 PM ------      Message from: Rosine Abe      Created: Wed Jan 09, 2013  6:33 PM       Call pt - I suggest he follows up with Dr. Raliegh Ip within 2 weeks.  Because of possibility of blood tinged sputum, I suggest CXR within 1-3 days. ------

## 2013-01-11 NOTE — Telephone Encounter (Signed)
Spoke to pt told him Dr. Shawna Orleans suggest Chest x-ray in 1-3 days and follow up with Dr. Raliegh Ip in 2 weeks. Pt stated he is 100% better and is not coughing any sputum up anymore and does not feel x-ray is necessary. Told pt okay just make sure make follow up appt with Dr. Raliegh Ip and if symptoms become worse again need to have x-ray. Pt verbalized understanding.

## 2013-02-07 ENCOUNTER — Other Ambulatory Visit (INDEPENDENT_AMBULATORY_CARE_PROVIDER_SITE_OTHER): Payer: 59

## 2013-02-07 DIAGNOSIS — Z0000 Encounter for general adult medical examination without abnormal findings: Secondary | ICD-10-CM

## 2013-02-07 LAB — POCT URINALYSIS DIPSTICK
Bilirubin, UA: NEGATIVE
Ketones, UA: NEGATIVE
Leukocytes, UA: NEGATIVE
Nitrite, UA: NEGATIVE
Protein, UA: NEGATIVE
RBC UA: NEGATIVE
Spec Grav, UA: 1.025
UROBILINOGEN UA: 0.2
pH, UA: 5.5

## 2013-02-07 LAB — CBC WITH DIFFERENTIAL/PLATELET
BASOS ABS: 0.1 10*3/uL (ref 0.0–0.1)
Basophils Relative: 0.9 % (ref 0.0–3.0)
EOS ABS: 0.2 10*3/uL (ref 0.0–0.7)
Eosinophils Relative: 2 % (ref 0.0–5.0)
HCT: 50.6 % (ref 39.0–52.0)
Hemoglobin: 16.6 g/dL (ref 13.0–17.0)
LYMPHS PCT: 19.5 % (ref 12.0–46.0)
Lymphs Abs: 2 10*3/uL (ref 0.7–4.0)
MCHC: 32.7 g/dL (ref 30.0–36.0)
MCV: 91.1 fl (ref 78.0–100.0)
Monocytes Absolute: 0.7 10*3/uL (ref 0.1–1.0)
Monocytes Relative: 6.8 % (ref 3.0–12.0)
NEUTROS PCT: 70.8 % (ref 43.0–77.0)
Neutro Abs: 7.1 10*3/uL (ref 1.4–7.7)
PLATELETS: 226 10*3/uL (ref 150.0–400.0)
RBC: 5.56 Mil/uL (ref 4.22–5.81)
RDW: 13.7 % (ref 11.5–14.6)
WBC: 10.1 10*3/uL (ref 4.5–10.5)

## 2013-02-07 LAB — HEPATIC FUNCTION PANEL
ALK PHOS: 67 U/L (ref 39–117)
ALT: 21 U/L (ref 0–53)
AST: 16 U/L (ref 0–37)
Albumin: 3.9 g/dL (ref 3.5–5.2)
BILIRUBIN DIRECT: 0.1 mg/dL (ref 0.0–0.3)
BILIRUBIN TOTAL: 0.7 mg/dL (ref 0.3–1.2)
Total Protein: 6.3 g/dL (ref 6.0–8.3)

## 2013-02-07 LAB — LIPID PANEL
Cholesterol: 117 mg/dL (ref 0–200)
HDL: 52.9 mg/dL (ref >39.00)
LDL Cholesterol: 48 mg/dL (ref 0–99)
Total CHOL/HDL Ratio: 2
Triglycerides: 83 mg/dL (ref 0.0–149.0)
VLDL: 16.6 mg/dL (ref 0.0–40.0)

## 2013-02-07 LAB — BASIC METABOLIC PANEL
BUN: 15 mg/dL (ref 6–23)
CO2: 29 mEq/L (ref 19–32)
Calcium: 9.1 mg/dL (ref 8.4–10.5)
Chloride: 106 mEq/L (ref 96–112)
Creatinine, Ser: 1 mg/dL (ref 0.4–1.5)
GFR: 85.53 mL/min (ref >60.00)
Glucose, Bld: 139 mg/dL — ABNORMAL HIGH (ref 70–99)
POTASSIUM: 4.8 meq/L (ref 3.5–5.1)
SODIUM: 142 meq/L (ref 135–145)

## 2013-02-07 LAB — PSA: PSA: 1.64 ng/mL (ref 0.10–4.00)

## 2013-02-07 LAB — TSH: TSH: 1.02 u[IU]/mL (ref 0.35–5.50)

## 2013-02-07 LAB — MICROALBUMIN / CREATININE URINE RATIO
Creatinine,U: 106.3 mg/dL
MICROALB UR: 0.2 mg/dL (ref 0.0–1.9)
Microalb Creat Ratio: 0.2 mg/g (ref 0.0–30.0)

## 2013-02-07 LAB — HEMOGLOBIN A1C: Hgb A1c MFr Bld: 7.6 % — ABNORMAL HIGH (ref 4.6–6.5)

## 2013-02-14 ENCOUNTER — Ambulatory Visit (INDEPENDENT_AMBULATORY_CARE_PROVIDER_SITE_OTHER): Payer: 59 | Admitting: Internal Medicine

## 2013-02-14 ENCOUNTER — Encounter: Payer: Self-pay | Admitting: Internal Medicine

## 2013-02-14 VITALS — BP 120/80 | HR 106 | Temp 98.2°F | Resp 20 | Ht 67.0 in | Wt 228.0 lb

## 2013-02-14 DIAGNOSIS — I10 Essential (primary) hypertension: Secondary | ICD-10-CM

## 2013-02-14 DIAGNOSIS — Z0000 Encounter for general adult medical examination without abnormal findings: Secondary | ICD-10-CM

## 2013-02-14 DIAGNOSIS — K219 Gastro-esophageal reflux disease without esophagitis: Secondary | ICD-10-CM

## 2013-02-14 DIAGNOSIS — E119 Type 2 diabetes mellitus without complications: Secondary | ICD-10-CM

## 2013-02-14 NOTE — Progress Notes (Signed)
Subjective:    Patient ID: Jonathan Gross, male    DOB: Jan 28, 1965, 48 y.o.   MRN: [id number]  HPI  48 year old patient who is seen today for a wellness exam. He is followed by endocrinology due to 2 type 2 diabetes. He has treated hypertension which has been stable. He is on statin therapy. In general doing quite well. Most recent hemoglobin A1c 7.6  Wt Readings from Last 3 Encounters:  02/14/13 228 lb (103.42 kg)  01/09/13 221 lb (100.245 kg)  08/10/12 222 lb (100.699 kg)    Past Medical History  Diagnosis Date  . ANXIETY 08/05/2008  . DIABETES MELLITUS, TYPE II 09/08/2006  . GERD 09/08/2006  . HYPERTENSION 09/08/2006  . PANIC DISORDER, HX OF 10/30/2006    History   Social History  . Marital Status: Married    Spouse Name: N/A    Number of Children: N/A  . Years of Education: N/A   Occupational History  . Not on file.   Social History Main Topics  . Smoking status: Former Smoker    Quit date: 01/10/1998  . Smokeless tobacco: Never Used  . Alcohol Use: No  . Drug Use: Not on file  . Sexual Activity: Not on file   Other Topics Concern  . Not on file   Social History Narrative  . No narrative on file    Past Surgical History  Procedure Laterality Date  . Appendectomy    . Vasectomy    . Toe amputation      Family History  Problem Relation Age of Onset  . Diabetes Mother   . Hypertension Sister   . Hyperlipidemia Sister   . Heart disease Sister     cabg age 11    Allergies  Allergen Reactions  . Amoxicillin Rash    Current Outpatient Prescriptions on File Prior to Visit  Medication Sig Dispense Refill  . atorvastatin (LIPITOR) 10 MG tablet Take 10 mg by mouth daily.       . Canagliflozin (INVOKANA) 300 MG TABS Take 1 tablet by mouth daily before breakfast.      . insulin glargine (LANTUS) 100 UNIT/ML injection 50 Units at bedtime.       . Insulin Pen Needle (BD PEN NEEDLE NANO U/F) 32G X 4 MM MISC 1 each by Does not apply route daily.  100 each   3  . lamoTRIgine (LAMICTAL) 200 MG tablet Take 200 mg by mouth daily.      Marland Kitchen lisinopril (PRINIVIL,ZESTRIL) 20 MG tablet Take 20 mg by mouth daily.      Marland Kitchen LORazepam (ATIVAN) 0.5 MG tablet Take 2 tablets (1 mg total) by mouth 2 (two) times daily as needed.  90 tablet  3  . metFORMIN (GLUCOPHAGE) 1000 MG tablet TAKE 1 TABLET TWICE A DAY  180 tablet  3   No current facility-administered medications on file prior to visit.    BP 120/80  Pulse 106  Temp(Src) 98.2 F (36.8 C) (Oral)  Resp 20  Ht 5\' 7"  (1.702 m)  Wt 228 lb (103.42 kg)  BMI 35.70 kg/m2  SpO2 98%      Review of Systems  Constitutional: Negative for fever, chills, activity change, appetite change and fatigue.  HENT: Negative for congestion, dental problem, ear pain, hearing loss, mouth sores, rhinorrhea, sinus pressure, sneezing, tinnitus, trouble swallowing and voice change.   Eyes: Negative for photophobia, pain, redness and visual disturbance.  Respiratory: Negative for apnea, cough, choking, chest tightness, shortness of breath  and wheezing.   Cardiovascular: Negative for chest pain, palpitations and leg swelling.  Gastrointestinal: Negative for nausea, vomiting, abdominal pain, diarrhea, constipation, blood in stool, abdominal distention, anal bleeding and rectal pain.  Genitourinary: Negative for dysuria, urgency, frequency, hematuria, flank pain, decreased urine volume, discharge, penile swelling, scrotal swelling, difficulty urinating, genital sores and testicular pain.  Musculoskeletal: Negative for arthralgias, back pain, gait problem, joint swelling, myalgias, neck pain and neck stiffness.  Skin: Negative for color change, rash and wound.  Neurological: Negative for dizziness, tremors, seizures, syncope, facial asymmetry, speech difficulty, weakness, light-headedness, numbness and headaches.  Hematological: Negative for adenopathy. Does not bruise/bleed easily.  Psychiatric/Behavioral: Negative for suicidal ideas,  hallucinations, behavioral problems, confusion, sleep disturbance, self-injury, dysphoric mood, decreased concentration and agitation. The patient is not nervous/anxious.        Objective:   Physical Exam  Constitutional: He appears well-developed and well-nourished.  HENT:  Head: Normocephalic and atraumatic.  Right Ear: External ear normal.  Left Ear: External ear normal.  Nose: Nose normal.  Mouth/Throat: Oropharynx is clear and moist.  Eyes: Conjunctivae and EOM are normal. Pupils are equal, round, and reactive to light. No scleral icterus.  Neck: Normal range of motion. Neck supple. No JVD present. No thyromegaly present.  Cardiovascular: Regular rhythm, normal heart sounds and intact distal pulses.  Exam reveals no gallop and no friction rub.   No murmur heard. Pulmonary/Chest: Effort normal and breath sounds normal. He exhibits no tenderness.  Abdominal: Soft. Bowel sounds are normal. He exhibits no distension and no mass. There is no tenderness.  Genitourinary: Prostate normal and penis normal.  Musculoskeletal: Normal range of motion. He exhibits no edema and no tenderness.  Lymphadenopathy:    He has no cervical adenopathy.  Neurological: He is alert. He has normal reflexes. No cranial nerve deficit. Coordination normal.  Skin: Skin is warm and dry. No rash noted.  Psychiatric: He has a normal mood and affect. His behavior is normal.          Assessment & Plan:   Preventive health examination Exogenous obesity Diabetes mellitus type 2 Hypertension well controlled  Nonpharmacologic measures discussed Follow up in endocrinology Home blood pressure monitoring encouraged Recheck one year or as needed Medications refilled and

## 2013-02-14 NOTE — Progress Notes (Signed)
Pre-visit discussion using our clinic review tool. No additional management support is needed unless otherwise documented below in the visit note.  

## 2013-02-14 NOTE — Patient Instructions (Signed)
Please check your hemoglobin A1c every 3 months  Limit your sodium (Salt) intake    It is important that you exercise regularly, at least 20 minutes 3 to 4 times per week.  If you develop chest pain or shortness of breath seek  medical attention.  You need to lose weight.  Consider a lower calorie diet and regular exercise.  Return in one year for follow-up

## 2013-02-15 ENCOUNTER — Telehealth: Payer: Self-pay

## 2013-02-15 ENCOUNTER — Telehealth: Payer: Self-pay | Admitting: Internal Medicine

## 2013-02-15 NOTE — Telephone Encounter (Signed)
Relevant patient education assigned to patient using Emmi. ° °

## 2014-02-03 ENCOUNTER — Telehealth: Payer: Self-pay | Admitting: Internal Medicine

## 2014-02-03 NOTE — Telephone Encounter (Addendum)
Pt would like to know if dr Raliegh Ip will write him rx for LORazepam (ATIVAN) 0.5 MG tablet  [facility]  Advised pt he may need appt.  Last written 09/23/11. Last seen 02/14/13. Pt states he is not seeing therapist that took over the prescribing of this med.

## 2014-02-04 MED ORDER — LORAZEPAM 0.5 MG PO TABS: 1.0000 mg | ORAL_TABLET | Freq: Two times a day (BID) | ORAL | Status: DC | PRN

## 2014-02-04 NOTE — Telephone Encounter (Signed)
Ok to RF? 

## 2014-02-04 NOTE — Telephone Encounter (Signed)
Please see message and advise if I can fill Lorazepam?

## 2014-02-04 NOTE — Telephone Encounter (Signed)
Pt notified Rx for Lorazepam called into pharmacy.

## 2014-07-07 ENCOUNTER — Other Ambulatory Visit: Payer: Self-pay

## 2014-11-13 ENCOUNTER — Other Ambulatory Visit: Payer: Self-pay | Admitting: Internal Medicine

## 2015-01-14 ENCOUNTER — Other Ambulatory Visit: Payer: Self-pay | Admitting: Internal Medicine

## 2015-01-16 ENCOUNTER — Encounter: Payer: Self-pay | Admitting: Internal Medicine

## 2015-01-16 ENCOUNTER — Ambulatory Visit (INDEPENDENT_AMBULATORY_CARE_PROVIDER_SITE_OTHER): Payer: 59 | Admitting: Internal Medicine

## 2015-01-16 VITALS — BP 138/84 | HR 104 | Temp 98.1°F | Resp 20 | Ht 67.0 in | Wt 195.0 lb

## 2015-01-16 DIAGNOSIS — F988 Other specified behavioral and emotional disorders with onset usually occurring in childhood and adolescence: Secondary | ICD-10-CM | POA: Insufficient documentation

## 2015-01-16 DIAGNOSIS — I10 Essential (primary) hypertension: Secondary | ICD-10-CM | POA: Diagnosis not present

## 2015-01-16 DIAGNOSIS — F900 Attention-deficit hyperactivity disorder, predominantly inattentive type: Secondary | ICD-10-CM

## 2015-01-16 DIAGNOSIS — Z0000 Encounter for general adult medical examination without abnormal findings: Secondary | ICD-10-CM | POA: Diagnosis not present

## 2015-01-16 DIAGNOSIS — E119 Type 2 diabetes mellitus without complications: Secondary | ICD-10-CM

## 2015-01-16 DIAGNOSIS — F909 Attention-deficit hyperactivity disorder, unspecified type: Secondary | ICD-10-CM | POA: Insufficient documentation

## 2015-01-16 MED ORDER — LORAZEPAM 0.5 MG PO TABS: ORAL_TABLET | ORAL | Status: DC

## 2015-01-16 NOTE — Progress Notes (Signed)
Subjective:    Patient ID: Jonathan Gross, male    DOB: 1965/04/13, 50 y.o.   MRN: [id number]  HPI 50 year old patient who is followed by endocrinology for type 2 diabetes.  Last hemoglobin A1c was 5.5.  Doing quite well. He has been evaluated for ADHD and now is on Vyvanse and Wellbutrin He has essential hypertension. He is doing reasonably well.  Requires a refill on lorazepam which she continues to take sporadically  No screening colonoscopy  Past Medical History  Diagnosis Date  . ANXIETY 08/05/2008  . DIABETES MELLITUS, TYPE II 09/08/2006  . GERD 09/08/2006  . HYPERTENSION 09/08/2006  . PANIC DISORDER, HX OF 10/30/2006    Social History   Social History  . Marital Status: Married    Spouse Name: N/A  . Number of Children: N/A  . Years of Education: N/A   Occupational History  . Not on file.   Social History Main Topics  . Smoking status: Former Smoker    Quit date: 01/10/1998  . Smokeless tobacco: Never Used  . Alcohol Use: No  . Drug Use: Not on file  . Sexual Activity: Not on file   Other Topics Concern  . Not on file   Social History Narrative    Past Surgical History  Procedure Laterality Date  . Appendectomy    . Vasectomy    . Toe amputation      Family History  Problem Relation Age of Onset  . Diabetes Mother   . Hypertension Sister   . Hyperlipidemia Sister   . Heart disease Sister     cabg age 85    Allergies  Allergen Reactions  . Amoxicillin Rash    Current Outpatient Prescriptions on File Prior to Visit  Medication Sig Dispense Refill  . atorvastatin (LIPITOR) 10 MG tablet Take 10 mg by mouth daily.     . insulin glargine (LANTUS) 100 UNIT/ML injection 50 Units at bedtime.     . Insulin Pen Needle (BD PEN NEEDLE NANO U/F) 32G X 4 MM MISC 1 each by Does not apply route daily. 100 each 3  . lisinopril (PRINIVIL,ZESTRIL) 20 MG tablet Take 20 mg by mouth daily.    . TRULICITY 1.5 0000000 SOPN Inject 1.5 mg into the skin once a week.       No current facility-administered medications on file prior to visit.    BP 138/84 mmHg  Pulse 104  Temp(Src) 98.1 F (36.7 C) (Oral)  Resp 20  Ht 5\' 7"  (1.702 m)  Wt 195 lb (88.451 kg)  BMI 30.53 kg/m2  SpO2 99%     Review of Systems  Constitutional: Negative for fever, chills, appetite change and fatigue.  HENT: Negative for congestion, dental problem, ear pain, hearing loss, sore throat, tinnitus, trouble swallowing and voice change.   Eyes: Negative for pain, discharge and visual disturbance.  Respiratory: Negative for cough, chest tightness, wheezing and stridor.   Cardiovascular: Negative for chest pain, palpitations and leg swelling.  Gastrointestinal: Negative for nausea, vomiting, abdominal pain, diarrhea, constipation, blood in stool and abdominal distention.  Genitourinary: Negative for urgency, hematuria, flank pain, discharge, difficulty urinating and genital sores.  Musculoskeletal: Negative for myalgias, back pain, joint swelling, arthralgias, gait problem and neck stiffness.  Skin: Negative for rash.  Neurological: Negative for dizziness, syncope, speech difficulty, weakness, numbness and headaches.  Hematological: Negative for adenopathy. Does not bruise/bleed easily.  Psychiatric/Behavioral: Positive for behavioral problems and decreased concentration. Negative for dysphoric mood. The patient is  nervous/anxious.        Objective:   Physical Exam  Constitutional: He is oriented to person, place, and time. He appears well-developed.  HENT:  Head: Normocephalic.  Right Ear: External ear normal.  Left Ear: External ear normal.  Eyes: Conjunctivae and EOM are normal.  Neck: Normal range of motion.  Cardiovascular: Normal rate and normal heart sounds.   Pulmonary/Chest: Breath sounds normal.  Abdominal: Bowel sounds are normal.  Musculoskeletal: Normal range of motion. He exhibits no edema or tenderness.  Neurological: He is alert and oriented to person,  place, and time.  Psychiatric: He has a normal mood and affect. His behavior is normal.          Assessment & Plan:   Diabetes mellitus.  Well-controlled Essential hypertension, stable.  Will continue home blood pressure monitoring.  No change in therapy ADHD.  Follow-up Dr. Nancie Neas Anxiety disorder.  Lorazepam refilled  Schedule screening colonoscopy  CPX 6 months

## 2015-01-16 NOTE — Patient Instructions (Signed)
It is important that you exercise regularly, at least 20 minutes 3 to 4 times per week.  If you develop chest pain or shortness of breath seek  medical attention.   Please check your hemoglobin A1c every 3 months  Limit your sodium (Salt) intake  You need to lose weight.  Consider a lower calorie diet and regular exercise. Return in 6 months for follow-up

## 2015-01-16 NOTE — Progress Notes (Signed)
Pre visit review using our clinic review tool, if applicable. No additional management support is needed unless otherwise documented below in the visit note. 

## 2015-03-09 ENCOUNTER — Encounter: Payer: Self-pay | Admitting: Gastroenterology

## 2015-03-20 ENCOUNTER — Ambulatory Visit (AMBULATORY_SURGERY_CENTER): Payer: Self-pay | Admitting: *Deleted

## 2015-03-20 VITALS — Ht 67.0 in | Wt 194.4 lb

## 2015-03-20 DIAGNOSIS — Z1211 Encounter for screening for malignant neoplasm of colon: Secondary | ICD-10-CM

## 2015-03-20 MED ORDER — NA SULFATE-K SULFATE-MG SULF 17.5-3.13-1.6 GM/177ML PO SOLN: ORAL | Status: DC

## 2015-03-20 NOTE — Progress Notes (Signed)
No allergies to eggs or soy. No problems with anesthesia.  Pt given Emmi instructions for colonoscopy  No oxygen use  No diet drug use  

## 2015-04-03 ENCOUNTER — Ambulatory Visit (AMBULATORY_SURGERY_CENTER): Payer: 59 | Admitting: Gastroenterology

## 2015-04-03 ENCOUNTER — Encounter: Payer: Self-pay | Admitting: Gastroenterology

## 2015-04-03 VITALS — BP 109/75 | HR 82 | Temp 97.5°F | Resp 14 | Ht 67.0 in | Wt 194.0 lb

## 2015-04-03 DIAGNOSIS — K635 Polyp of colon: Secondary | ICD-10-CM | POA: Diagnosis not present

## 2015-04-03 DIAGNOSIS — D123 Benign neoplasm of transverse colon: Secondary | ICD-10-CM | POA: Diagnosis not present

## 2015-04-03 DIAGNOSIS — D127 Benign neoplasm of rectosigmoid junction: Secondary | ICD-10-CM

## 2015-04-03 DIAGNOSIS — Z1211 Encounter for screening for malignant neoplasm of colon: Secondary | ICD-10-CM | POA: Diagnosis not present

## 2015-04-03 LAB — GLUCOSE, CAPILLARY
Glucose-Capillary: 122 mg/dL — ABNORMAL HIGH (ref 65–99)
Glucose-Capillary: 118 mg/dL — ABNORMAL HIGH (ref 65–99)

## 2015-04-03 MED ORDER — SODIUM CHLORIDE 0.9 % IV SOLN: 500.0000 mL | INTRAVENOUS | Status: DC

## 2015-04-03 NOTE — Patient Instructions (Addendum)
YOU HAD AN ENDOSCOPIC PROCEDURE TODAY AT [facility]:   Refer to the procedure report that was given to you for any specific questions about what was found during the examination.  If the procedure report does not answer your questions, please call your gastroenterologist to clarify.  If you requested that your care partner not be given the details of your procedure findings, then the procedure report has been included in a sealed envelope for you to review at your convenience later.  YOU SHOULD EXPECT: Some feelings of bloating in the abdomen. Passage of more gas than usual.  Walking can help get rid of the air that was put into your GI tract during the procedure and reduce the bloating. If you had a lower endoscopy (such as a colonoscopy or flexible sigmoidoscopy) you may notice spotting of blood in your stool or on the toilet paper. If you underwent a bowel prep for your procedure, you may not have a normal bowel movement for a few days.  Please Note:  You might notice some irritation and congestion in your nose or some drainage.  This is from the oxygen used during your procedure.  There is no need for concern and it should clear up in a day or so.  SYMPTOMS TO REPORT IMMEDIATELY:   Following lower endoscopy (colonoscopy or flexible sigmoidoscopy):  Excessive amounts of blood in the stool  Significant tenderness or worsening of abdominal pains  Swelling of the abdomen that is new, acute  Fever of 100F or higher   Following upper endoscopy (EGD)  Vomiting of blood or coffee ground material  New chest pain or pain under the shoulder blades  Painful or persistently difficult swallowing  New shortness of breath  Fever of 100F or higher  Black, tarry-looking stools  For urgent or emergent issues, a gastroenterologist can be reached at any hour by calling [phone].   DIET: Your first meal following the procedure should be a small meal and then it is ok to progress to  your normal diet. Heavy or fried foods are harder to digest and may make you feel nauseous or bloated.  Likewise, meals heavy in dairy and vegetables can increase bloating.  Drink plenty of fluids but you should avoid alcoholic beverages for 24 hours. Try to increase the fiber in your diet.  Drink plenty of water.  ACTIVITY:  You should plan to take it easy for the rest of today and you should NOT DRIVE or use heavy machinery until tomorrow (because of the sedation medicines used during the test).    FOLLOW UP: Our staff will call the number listed on your records the next business day following your procedure to check on you and address any questions or concerns that you may have regarding the information given to you following your procedure. If we do not reach you, we will leave a message.  However, if you are feeling well and you are not experiencing any problems, there is no need to return our call.  We will assume that you have returned to your regular daily activities without incident.  If any biopsies were taken you will be contacted by phone or by letter within the next 1-3 weeks.  Please call us at [phone] if you have not heard about the biopsies in 3 weeks.    SIGNATURES/CONFIDENTIALITY: You and/or your care partner have signed paperwork which will be entered into your electronic medical record.  These signatures attest to the fact  that that the information above on your After Visit Summary has been reviewed and is understood.  Full responsibility of the confidentiality of this discharge information lies with you and/or your care-partner.  Read all of the handouts given to you by your recovery room nurse.  Thank-you for choosing [facility] today for your healthcare needs.

## 2015-04-03 NOTE — Progress Notes (Signed)
Called to room to assist during endoscopic procedure.  Patient ID and intended procedure confirmed with present staff. Received instructions for my participation in the procedure from the performing physician.  

## 2015-04-03 NOTE — Progress Notes (Signed)
A/ox3 pleased with MAC, report to Jane RN 

## 2015-04-03 NOTE — Op Note (Signed)
[place] Patient Name: Jonathan Gross Procedure Date: 04/03/2015 11:10 AM MRN: [id number] Endoscopist: Remo Lipps P. Havery Moros , MD Age: 50 Referring MD:  Date of Birth: July 23, 1965 Gender: Male Procedure:                Colonoscopy Indications:              Screening for colorectal malignant neoplasm Medicines:                Monitored Anesthesia Care Procedure:                Pre-Anesthesia Assessment:                           - Prior to the procedure, a History and Physical                            was performed, and patient medications and                            allergies were reviewed. The patient's tolerance of                            previous anesthesia was also reviewed. The risks                            and benefits of the procedure and the sedation                            options and risks were discussed with the patient.                            All questions were answered, and informed consent                            was obtained. Prior Anticoagulants: The patient has                            taken aspirin, last dose was 1 day prior to                            procedure. ASA Grade Assessment: II - A patient                            with mild systemic disease. After reviewing the                            risks and benefits, the patient was deemed in                            satisfactory condition to undergo the procedure.                           After obtaining informed consent, the colonoscope  was passed under direct vision. Throughout the                            procedure, the patient's blood pressure, pulse, and                            oxygen saturations were monitored continuously. The                            Model CF-HQ190L [id number]) scope was introduced                            through the anus and advanced to the the terminal                            ileum, with identification of the  appendiceal                            orifice and IC valve. The colonoscopy was performed                            without difficulty. The patient tolerated the                            procedure well. The quality of the bowel                            preparation was adequate. The terminal ileum,                            ileocecal valve, appendiceal orifice, and rectum                            were photographed. Scope In: 11:14:37 AM Scope Out: 11:38:27 AM Scope Withdrawal Time: 0 hours 18 minutes 31 seconds  Total Procedure Duration: 0 hours 23 minutes 50 seconds  Findings:      The perianal and digital rectal examinations were normal.      A 4 mm polyp was found in the splenic flexure. The polyp was sessile.       The polyp was removed with a cold snare. Resection and retrieval were       complete.      A 4 mm polyp was found in the recto-sigmoid colon. The polyp was       sessile. The polyp was removed with a cold snare. Resection and       retrieval were complete.      One possible 5-6 mm nodule / protuberance was found appendiceal orifice.       Unclear if this is normal varient vs. subepithelial nodule. Biopsies       were taken with a cold forceps for histology.      A few small-mouthed diverticula were found in the left colon.      Non-bleeding internal hemorrhoids were found during retroflexion.      The terminal ileum appeared normal.      The exam was otherwise without abnormality. Complications:  No immediate complications. Estimated blood loss:                            Minimal. Estimated Blood Loss:     Estimated blood loss was minimal. Impression:               - One 4 mm polyp at the splenic flexure, removed                            with a cold snare. Resected and retrieved.                           - One 4 mm polyp at the recto-sigmoid colon,                            removed with a cold snare. Resected and retrieved.                            - Nodule at the appendiceal orifice. Biopsied.                           - Diverticulosis in the left colon.                           - Non-bleeding internal hemorrhoids.                           - The examined portion of the ileum was normal.                           - The examination was otherwise normal. Recommendation:           - Patient has a contact number available for                            emergencies. The signs and symptoms of potential                            delayed complications were discussed with the                            patient. Return to normal activities tomorrow.                            Written discharge instructions were provided to the                            patient.                           - Resume previous diet.                           - Continue present medications.                           -  No aspirin, ibuprofen, naproxen, or other                            non-steroidal anti-inflammatory drugs for 2 weeks                            after polyp removal.                           - Await pathology results.                           - Repeat colonoscopy is recommended for                            surveillance. The colonoscopy date will be                            determined after pathology results from today's                            exam become available for review. Procedure Code(s):        --- Professional ---                           [phone], Colonoscopy, flexible; with removal of                            tumor(s), polyp(s), or other lesion(s) by snare                            technique                           [id number], 86, Colonoscopy, flexible; with biopsy,                            single or multiple CPT copyright 2016 [employer] Association. All rights reserved. Remo Lipps P. Havery Moros, MD Jonathan Raspberry. Paiten Boies, MD 04/03/2015 11:50:37 AM This report has been signed electronically. Number of Addenda:  0 Referring MD:      Marletta Lor

## 2015-04-06 ENCOUNTER — Telehealth: Payer: Self-pay

## 2015-04-06 NOTE — Telephone Encounter (Signed)
  Follow up Call-  Call back number 04/03/2015  Post procedure Call Back phone  # [phone]  Permission to leave phone message Yes     Patient questions:  Do you have a fever, pain , or abdominal swelling? No. Pain Score  0 *  Have you tolerated food without any problems? Yes.    Have you been able to return to your normal activities? Yes.    Do you have any questions about your discharge instructions: Diet   No. Medications  No. Follow up visit  No.  Do you have questions or concerns about your Care? No.  Actions: * If pain score is 4 or above: No action needed, pain <4.

## 2015-04-08 ENCOUNTER — Other Ambulatory Visit: Payer: Self-pay | Admitting: *Deleted

## 2015-04-08 ENCOUNTER — Encounter: Payer: Self-pay | Admitting: *Deleted

## 2015-04-08 DIAGNOSIS — R933 Abnormal findings on diagnostic imaging of other parts of digestive tract: Secondary | ICD-10-CM

## 2015-06-15 ENCOUNTER — Telehealth: Payer: Self-pay | Admitting: *Deleted

## 2015-06-15 ENCOUNTER — Other Ambulatory Visit (INDEPENDENT_AMBULATORY_CARE_PROVIDER_SITE_OTHER): Payer: 59

## 2015-06-15 DIAGNOSIS — R933 Abnormal findings on diagnostic imaging of other parts of digestive tract: Secondary | ICD-10-CM | POA: Diagnosis not present

## 2015-06-15 LAB — CREATININE, SERUM: Creatinine, Ser: 0.93 mg/dL (ref 0.40–1.50)

## 2015-06-15 LAB — BUN: BUN: 15 mg/dL (ref 6–23)

## 2015-06-15 NOTE — Telephone Encounter (Signed)
Patient left a message that he lost his contrast for CT. Left a message for patient to call back.

## 2015-06-15 NOTE — Telephone Encounter (Signed)
Pt said he is returning your call [phone]

## 2015-06-15 NOTE — Telephone Encounter (Signed)
Spoke with patient. New instructions and contrast up front for pick up.

## 2015-06-25 ENCOUNTER — Other Ambulatory Visit: Payer: Self-pay | Admitting: Gastroenterology

## 2015-06-25 ENCOUNTER — Ambulatory Visit (INDEPENDENT_AMBULATORY_CARE_PROVIDER_SITE_OTHER)
Admission: RE | Admit: 2015-06-25 | Discharge: 2015-06-25 | Disposition: A | Discharge status: Home / Self Care | Payer: 59 | Source: Ambulatory Visit | Attending: Gastroenterology | Admitting: Gastroenterology

## 2015-06-25 DIAGNOSIS — R933 Abnormal findings on diagnostic imaging of other parts of digestive tract: Secondary | ICD-10-CM

## 2015-06-25 MED ORDER — IOPAMIDOL (ISOVUE-300) INJECTION 61%
100.0000 mL | Freq: Once | INTRAVENOUS | Status: AC | PRN
  Administered 2015-06-25: 100 mL via INTRAVENOUS

## 2015-06-30 ENCOUNTER — Telehealth: Payer: Self-pay

## 2015-06-30 NOTE — Telephone Encounter (Signed)
-----   Message from [name], MD sent at 06/30/2015  7:17 AM EDT ----- Regarding: RE: possible EUS? Jonathan Gross, I don't think standard EGD will be able to see this lesion.  Agree that upper EUS a better first test.  I'll let Deosha Werden know if you can give the patient a heads up in meantime.  Thanks  Jerusalen Mateja, This man needs upper EUS, ++MAC, gastric mass, next available EUS Thursday.  Thanks  DJ  ----- Message -----    From: Manus Gunning, MD    Sent: 06/29/2015   1:07 PM      To: [name], MD Subject: possible EUS?                                  Melissa Montane, I was wondering if you could take a look at this patient's CT whenever you have a few minutes, nonurgent. Looks like a 1.8cm lesion in the cardia, seems pretty deep. I was going to offer the patient EGD but based on imaging was wondering if you thought EUS may be needed to see it.   CT was initially done to look at the appendiceal orifice for colonoscopy abnormality, this was an incidental finding. Thanks for any insight.   Jonathan Gross

## 2015-07-02 ENCOUNTER — Other Ambulatory Visit: Payer: Self-pay

## 2015-07-02 DIAGNOSIS — K3189 Other diseases of stomach and duodenum: Secondary | ICD-10-CM

## 2015-07-02 NOTE — Telephone Encounter (Signed)
EUS scheduled, pt instructed and medications reviewed.  Patient instructions mailed to home.  Patient to call with any questions or concerns.  

## 2015-07-06 ENCOUNTER — Ambulatory Visit (INDEPENDENT_AMBULATORY_CARE_PROVIDER_SITE_OTHER): Payer: 59 | Admitting: Internal Medicine

## 2015-07-06 ENCOUNTER — Encounter: Payer: Self-pay | Admitting: Internal Medicine

## 2015-07-06 VITALS — BP 130/80 | HR 76 | Temp 98.1°F | Resp 20 | Ht 67.0 in | Wt 191.0 lb

## 2015-07-06 DIAGNOSIS — E119 Type 2 diabetes mellitus without complications: Secondary | ICD-10-CM | POA: Diagnosis not present

## 2015-07-06 DIAGNOSIS — K429 Umbilical hernia without obstruction or gangrene: Secondary | ICD-10-CM | POA: Diagnosis not present

## 2015-07-06 LAB — POCT GLUCOSE (DEVICE FOR HOME USE): Glucose Fasting, POC: 109 mg/dL — AB (ref 70–99)

## 2015-07-06 NOTE — Progress Notes (Signed)
Subjective:    Patient ID: Jonathan Gross, male    DOB: [birth year], 50 y.o.   MRN: [id number]  HPI  50 year old patient who has a history of type 2 diabetes followed by endocrinology.  He states that he has had a long-standing small umbilical hernia.  2 days ago he was involved in considerable outdoor activities including heavy lifting.  He developed umbilical pain that was quite bothersome for several hours.  Today he feels well  He is scheduled for GI evaluation next month to evaluate a lesion near the gastric cardia noted on abdominal CT scanning  Past Medical History  Diagnosis Date  . ANXIETY 08/05/2008  . DIABETES MELLITUS, TYPE II 09/08/2006  . GERD 09/08/2006  . HYPERTENSION 09/08/2006  . PANIC DISORDER, HX OF 10/30/2006     Social History   Social History  . Marital Status: Married    Spouse Name: N/A  . Number of Children: N/A  . Years of Education: N/A   Occupational History  . Not on file.   Social History Main Topics  . Smoking status: Former Smoker    Quit date: 01/10/1998  . Smokeless tobacco: Never Used  . Alcohol Use: No  . Drug Use: No  . Sexual Activity: Not on file   Other Topics Concern  . Not on file   Social History Narrative    Past Surgical History  Procedure Laterality Date  . Appendectomy  1997  . Vasectomy  1999  . 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 Diabetes Mother   . Hypertension Sister   . Hyperlipidemia Sister   . Heart disease Sister     cabg age 74  . Colon cancer Neg Hx     Allergies  Allergen Reactions  . Amoxicillin Rash    Current Outpatient Prescriptions on File Prior to Visit  Medication Sig Dispense Refill  . aspirin 81 MG tablet Take 81 mg by mouth daily.    Marland Kitchen atorvastatin (LIPITOR) 10 MG tablet Take 10 mg by mouth daily.     . Biotin 1000 MCG tablet Take 1,000 mcg by mouth daily.    Marland Kitchen buPROPion (WELLBUTRIN XL) 300 MG 24 hr tablet Take 300 mg  by mouth daily.  3  . CINNAMON PO Take by mouth daily.    . Insulin Pen Needle (BD PEN NEEDLE NANO U/F) 32G X 4 MM MISC 1 each by Does not apply route daily. 100 each 3  . lisinopril (PRINIVIL,ZESTRIL) 20 MG tablet Take 20 mg by mouth daily.    Marland Kitchen LORazepam (ATIVAN) 0.5 MG tablet TAKE 2 TABLETS TWICE A DAY AS NEEDED 180 tablet 1  . Omega-3 Fatty Acids (FISH OIL) 1200 MG CAPS Take by mouth daily.    Glory Rosebush VERIO test strip Reported on 04/03/2015  10  . TRULICITY 1.5 0000000 SOPN Inject 1.5 mg into the skin once a week. Reported on 03/20/2015    . XIGDUO XR 05-998 MG TB24 Take 2 tablets by mouth daily.  2   No current facility-administered medications on file prior to visit.    BP 130/80 mmHg  Pulse 76  Temp(Src) 98.1 F (36.7 C) (Oral)  Resp 20  Ht 5\' 7"  (1.702 m)  Wt 191 lb (86.637 kg)  BMI 29.91 kg/m2  SpO2 99%      Review of Systems  Gastrointestinal: Positive for abdominal pain.  Objective:   Physical Exam  Constitutional: He appears well-developed and well-nourished. No distress.  Abdominal:  Small easily reducible umbilical hernia Ventral hernia also noted superior to the umbilicus          Assessment & Plan:   Umbilical hernia/ventral hernia.  Patient will proceed with GI evaluation next month as planned.  He will contact the office if he develops any further abdominal pain related to his umbilical hernia for general surgical referral  Nyoka Cowden, MD

## 2015-07-06 NOTE — Patient Instructions (Signed)
Call or return to clinic prn if these symptoms worsen or fail to improve as anticipated.

## 2015-07-06 NOTE — Progress Notes (Signed)
Pre visit review using our clinic review tool, if applicable. No additional management support is needed unless otherwise documented below in the visit note. 

## 2015-07-07 ENCOUNTER — Encounter (HOSPITAL_COMMUNITY): Payer: Self-pay | Admitting: *Deleted

## 2015-07-08 ENCOUNTER — Telehealth: Payer: Self-pay | Admitting: *Deleted

## 2015-07-08 NOTE — Telephone Encounter (Signed)
[facility] Night - Client TELEPHONE [facility] Patient Name: Jonathan Gross Gender: Male DOB: [birth year] Age: 50 Y 11 D Return Phone Number: [phone] (Primary) Address: City/State/Zip: [place] [address] Client [facility] Night - Client Client Site [facility] - Night Physician Simonne Martinet - MD Contact Type Call Who Is Calling Patient / Member / Family / Caregiver Call Type Triage / Clinical Relationship To Patient Self Return Phone Number [phone] (Primary) Chief Complaint Abdominal Pain Reason for Call Symptomatic / Request for Health Information Initial Comment having a lot of pain in abdomen PreDisposition Did not know what to do Translation No Nurse Assessment Nurse: Merri Ray, RN, Melissa Date/Time (Eastern Time): 07/04/2015 12:56:51 PM Confirm and document reason for call. If symptomatic, describe symptoms. You must click the next button to save text entered. ---Caller states his ABD pain is like at a four- has a semiherniated belly button. Pain started this morning. pain in the groin when he walks-mild. no urinating pain. no fever Has the patient traveled out of the country within the last 30 days? ---No Does the patient have any new or worsening symptoms? ---Yes Will a triage be completed? ---Yes Related visit to physician within the last 2 weeks? ---No Does the PT have any chronic conditions? (i.e. diabetes, asthma, etc.) ---Yes List chronic conditions. ---DM Is this a behavioral health or substance abuse call? ---No Guidelines Guideline Title Affirmed Question Affirmed Notes Nurse Date/Time (Eastern Time) Abdominal Pain - Male [1] MODERATE pain (e.g., interferes with normal activities) AND [2] pain comes and goes (cramps) AND [3] present > 24 hours (Exception: pain with Vomiting or Diarrhea - see that Guideline) Cress, RN, Melissa 07/04/2015 1:06:58  PM Disp. Time Eilene Ghazi Time) Disposition Final User PLEASE NOTE: All timestamps contained within this report are represented as [place] Standard Time. CONFIDENTIALTY NOTICE: This fax transmission is intended only for the addressee. It contains information that is legally privileged, confidential or otherwise protected from use or disclosure. If you are not the intended recipient, you are strictly prohibited from reviewing, disclosing, copying using or disseminating any of this information or taking any action in reliance on or regarding this information. If you have received this fax in error, please notify us immediately by telephone so that we can arrange for its return to [place]. Phone: [phone], Toll-Free: [phone], Fax: [phone] Page: 2 of 2 Call Id: [id number] 07/04/2015 1:20:34 PM See Physician within 24 Hours Yes Merri Ray, RN, Restaurant manager, fast food Understands: Yes Disagree/Comply: Comply Care Advice Given Per Guideline SEE PHYSICIAN WITHIN 24 HOURS: CARE ADVICE given per Abdominal Pain, Male (Adult) guideline. * You become worse. * Constant pain lasts over 2 hours * Severe pain lasts over 1 hour CALL BACK IF: * IF OFFICE WILL BE CLOSED AND NO PCP TRIAGE: You need to be seen within the next 24 hours. An urgent care center is often a good source of care if your doctor's office is closed. * Drink adequate fluids. Eat a bland diet. DIET: Referrals [facility]

## 2015-07-08 NOTE — Telephone Encounter (Signed)
This call came in on 07/04/15, pt was seen on 07/06/15.  Adding to chart late as it was overlooked.

## 2015-07-16 ENCOUNTER — Encounter (HOSPITAL_COMMUNITY): Payer: Self-pay | Admitting: Anesthesiology

## 2015-07-16 ENCOUNTER — Ambulatory Visit (HOSPITAL_COMMUNITY): Payer: 59 | Admitting: Anesthesiology

## 2015-07-16 ENCOUNTER — Ambulatory Visit (HOSPITAL_COMMUNITY)
Admission: RE | Admit: 2015-07-16 | Discharge: 2015-07-16 | Disposition: A | Discharge status: Home / Self Care | Payer: 59 | Source: Ambulatory Visit | Attending: Gastroenterology | Admitting: Gastroenterology

## 2015-07-16 ENCOUNTER — Encounter (HOSPITAL_COMMUNITY)
Admission: RE | Disposition: A | Discharge status: Home / Self Care | Payer: Self-pay | Source: Ambulatory Visit | Attending: Gastroenterology

## 2015-07-16 DIAGNOSIS — E119 Type 2 diabetes mellitus without complications: Secondary | ICD-10-CM | POA: Insufficient documentation

## 2015-07-16 DIAGNOSIS — Z87891 Personal history of nicotine dependence: Secondary | ICD-10-CM | POA: Insufficient documentation

## 2015-07-16 DIAGNOSIS — Z794 Long term (current) use of insulin: Secondary | ICD-10-CM | POA: Insufficient documentation

## 2015-07-16 DIAGNOSIS — R933 Abnormal findings on diagnostic imaging of other parts of digestive tract: Secondary | ICD-10-CM

## 2015-07-16 DIAGNOSIS — Z7982 Long term (current) use of aspirin: Secondary | ICD-10-CM | POA: Insufficient documentation

## 2015-07-16 DIAGNOSIS — K3189 Other diseases of stomach and duodenum: Secondary | ICD-10-CM | POA: Diagnosis present

## 2015-07-16 DIAGNOSIS — F419 Anxiety disorder, unspecified: Secondary | ICD-10-CM | POA: Insufficient documentation

## 2015-07-16 DIAGNOSIS — Z79899 Other long term (current) drug therapy: Secondary | ICD-10-CM | POA: Diagnosis not present

## 2015-07-16 DIAGNOSIS — K429 Umbilical hernia without obstruction or gangrene: Secondary | ICD-10-CM | POA: Diagnosis not present

## 2015-07-16 DIAGNOSIS — D490 Neoplasm of unspecified behavior of digestive system: Secondary | ICD-10-CM

## 2015-07-16 DIAGNOSIS — I10 Essential (primary) hypertension: Secondary | ICD-10-CM | POA: Diagnosis not present

## 2015-07-16 HISTORY — PX: FINE NEEDLE ASPIRATION: SHX5430

## 2015-07-16 HISTORY — PX: ESOPHAGOGASTRODUODENOSCOPY (EGD) WITH PROPOFOL: SHX5813

## 2015-07-16 HISTORY — PX: EUS: SHX5427

## 2015-07-16 HISTORY — DX: Family history of other specified conditions: Z84.89

## 2015-07-16 LAB — GLUCOSE, CAPILLARY: GLUCOSE-CAPILLARY: 129 mg/dL — AB (ref 65–99)

## 2015-07-16 SURGERY — UPPER ENDOSCOPIC ULTRASOUND (EUS) RADIAL
Anesthesia: Monitor Anesthesia Care

## 2015-07-16 MED ORDER — SODIUM CHLORIDE 0.9 % IV SOLN: INTRAVENOUS | Status: DC

## 2015-07-16 MED ORDER — PROPOFOL 10 MG/ML IV BOLUS
INTRAVENOUS | Status: AC
  Filled 2015-07-16: qty 60

## 2015-07-16 MED ORDER — LIDOCAINE HCL (CARDIAC) 20 MG/ML IV SOLN
INTRAVENOUS | Status: DC | PRN
  Administered 2015-07-16: 25 mg via INTRATRACHEAL

## 2015-07-16 MED ORDER — PROPOFOL 500 MG/50ML IV EMUL
INTRAVENOUS | Status: DC | PRN
  Administered 2015-07-16: 150 ug/kg/min via INTRAVENOUS

## 2015-07-16 MED ORDER — PROPOFOL 10 MG/ML IV BOLUS
INTRAVENOUS | Status: DC | PRN
  Administered 2015-07-16: 20 mg via INTRAVENOUS
  Administered 2015-07-16: 30 mg via INTRAVENOUS
  Administered 2015-07-16: 20 mg via INTRAVENOUS

## 2015-07-16 MED ORDER — LACTATED RINGERS IV SOLN
INTRAVENOUS | Status: DC
Start: 2015-07-16 — End: 2015-07-16
  Administered 2015-07-16: 12:00:00 via INTRAVENOUS

## 2015-07-16 NOTE — H&P (View-Only) (Signed)
Subjective:    Patient ID: Jonathan Gross, male    DOB: 1965/03/02, 50 y.o.   MRN: [id number]  HPI  50 year old patient who has a history of type 2 diabetes followed by endocrinology.  He states that he has had a long-standing small umbilical hernia.  2 days ago he was involved in considerable outdoor activities including heavy lifting.  He developed umbilical pain that was quite bothersome for several hours.  Today he feels well  He is scheduled for GI evaluation next month to evaluate a lesion near the gastric cardia noted on abdominal CT scanning  Past Medical History  Diagnosis Date  . ANXIETY 08/05/2008  . DIABETES MELLITUS, TYPE II 09/08/2006  . GERD 09/08/2006  . HYPERTENSION 09/08/2006  . PANIC DISORDER, HX OF 10/30/2006     Social History   Social History  . Marital Status: Married    Spouse Name: N/A  . Number of Children: N/A  . Years of Education: N/A   Occupational History  . Not on file.   Social History Main Topics  . Smoking status: Former Smoker    Quit date: 01/10/1998  . Smokeless tobacco: Never Used  . Alcohol Use: No  . Drug Use: No  . Sexual Activity: Not on file   Other Topics Concern  . Not on file   Social History Narrative    Past Surgical History  Procedure Laterality Date  . Appendectomy  1997  . Vasectomy  1999  . 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 Diabetes Mother   . Hypertension Sister   . Hyperlipidemia Sister   . Heart disease Sister     cabg age 34  . Colon cancer Neg Hx     Allergies  Allergen Reactions  . Amoxicillin Rash    Current Outpatient Prescriptions on File Prior to Visit  Medication Sig Dispense Refill  . aspirin 81 MG tablet Take 81 mg by mouth daily.    Marland Kitchen atorvastatin (LIPITOR) 10 MG tablet Take 10 mg by mouth daily.     . Biotin 1000 MCG tablet Take 1,000 mcg by mouth daily.    Marland Kitchen buPROPion (WELLBUTRIN XL) 300 MG 24 hr tablet Take 300 mg  by mouth daily.  3  . CINNAMON PO Take by mouth daily.    . Insulin Pen Needle (BD PEN NEEDLE NANO U/F) 32G X 4 MM MISC 1 each by Does not apply route daily. 100 each 3  . lisinopril (PRINIVIL,ZESTRIL) 20 MG tablet Take 20 mg by mouth daily.    Marland Kitchen LORazepam (ATIVAN) 0.5 MG tablet TAKE 2 TABLETS TWICE A DAY AS NEEDED 180 tablet 1  . Omega-3 Fatty Acids (FISH OIL) 1200 MG CAPS Take by mouth daily.    Glory Rosebush VERIO test strip Reported on 04/03/2015  10  . TRULICITY 1.5 0000000 SOPN Inject 1.5 mg into the skin once a week. Reported on 03/20/2015    . XIGDUO XR 05-998 MG TB24 Take 2 tablets by mouth daily.  2   No current facility-administered medications on file prior to visit.    BP 130/80 mmHg  Pulse 76  Temp(Src) 98.1 F (36.7 C) (Oral)  Resp 20  Ht 5\' 7"  (1.702 m)  Wt 191 lb (86.637 kg)  BMI 29.91 kg/m2  SpO2 99%      Review of Systems  Gastrointestinal: Positive for abdominal pain.  Objective:   Physical Exam  Constitutional: He appears well-developed and well-nourished. No distress.  Abdominal:  Small easily reducible umbilical hernia Ventral hernia also noted superior to the umbilicus          Assessment & Plan:   Umbilical hernia/ventral hernia.  Patient will proceed with GI evaluation next month as planned.  He will contact the office if he develops any further abdominal pain related to his umbilical hernia for general surgical referral  Nyoka Cowden, MD

## 2015-07-16 NOTE — Interval H&P Note (Signed)
History and Physical Interval Note:  07/16/2015 12:30 PM  Jonathan Gross  has presented today for surgery, with the diagnosis of gastric mass  The various methods of treatment have been discussed with the patient and family. After consideration of risks, benefits and other options for treatment, the patient has consented to  Procedure(s): UPPER ENDOSCOPIC ULTRASOUND (EUS) RADIAL (N/A) as a surgical intervention .  The patient's history has been reviewed, patient examined, no change in status, stable for surgery.  I have reviewed the patient's chart and labs.  Questions were answered to the patient's satisfaction.     [name]

## 2015-07-16 NOTE — Discharge Instructions (Signed)

## 2015-07-16 NOTE — Anesthesia Preprocedure Evaluation (Signed)
Anesthesia Evaluation  Patient identified by MRN, date of birth, ID band Patient awake    Reviewed: Allergy & Precautions, NPO status , Patient's Chart, lab work & pertinent test results  History of Anesthesia Complications (+) Family history of anesthesia reaction  Airway Mallampati: II  TM Distance: >3 FB Neck ROM: Full    Dental no notable dental hx.    Pulmonary neg pulmonary ROS, former smoker,    Pulmonary exam normal breath sounds clear to auscultation       Cardiovascular hypertension, Pt. on medications Normal cardiovascular exam Rhythm:Regular Rate:Normal     Neuro/Psych PSYCHIATRIC DISORDERS Anxiety negative neurological ROS     GI/Hepatic Neg liver ROS, GERD  ,  Endo/Other  negative endocrine ROSdiabetes  Renal/GU negative Renal ROS  negative genitourinary   Musculoskeletal negative musculoskeletal ROS (+)   Abdominal   Peds negative pediatric ROS (+)  Hematology negative hematology ROS (+)   Anesthesia Other Findings   Reproductive/Obstetrics negative OB ROS                             Anesthesia Physical Anesthesia Plan  ASA: II  Anesthesia Plan: MAC   Post-op Pain Management:    Induction: Intravenous  Airway Management Planned: Natural Airway  Additional Equipment:   Intra-op Plan:   Post-operative Plan:   Informed Consent: I have reviewed the patients History and Physical, chart, labs and discussed the procedure including the risks, benefits and alternatives for the proposed anesthesia with the patient or authorized representative who has indicated his/her understanding and acceptance.   Dental advisory given  Plan Discussed with: CRNA  Anesthesia Plan Comments:         Anesthesia Quick Evaluation

## 2015-07-16 NOTE — Op Note (Signed)
[facility] Patient Name: Jonathan Gross Procedure Date: 07/16/2015 MRN: [id number] Attending MD: [name] , MD Date of Birth: Feb 19, 1965 CSN: [id number] Age: 50 Admit Type: Outpatient Procedure:                Upper EUS Indications:              Suspected mass in stomach on CT scan (incidentally                            noted gastric cardia wall mass) Providers:                [name], MD, Hilma Favors, RN, Elspeth Cho, Technician, Dione Booze, CRNA Referring MD:             Jolly Mango, MD Medicines:                Monitored Anesthesia Care Complications:            No immediate complications. Estimated blood loss:                            None. Estimated Blood Loss:     Estimated blood loss: none. Procedure:                Pre-Anesthesia Assessment:                           - Prior to the procedure, a History and Physical                            was performed, and patient medications and                            allergies were reviewed. The patient's tolerance of                            previous anesthesia was also reviewed. The risks                            and benefits of the procedure and the sedation                            options and risks were discussed with the patient.                            All questions were answered, and informed consent                            was obtained. Prior Anticoagulants: The patient has                            taken no previous anticoagulant or antiplatelet  agents. ASA Grade Assessment: II - A patient with                            mild systemic disease. After reviewing the risks                            and benefits, the patient was deemed in                            satisfactory condition to undergo the procedure.                           After obtaining informed consent, the endoscope was                             passed under direct vision. Throughout the                            procedure, the patient's blood pressure, pulse, and                            oxygen saturations were monitored continuously. The                            [id number]) scope was introduced through                            the mouth, and advanced to the body of the stomach.                            The upper EUS was accomplished without difficulty.                            The patient tolerated the procedure well. Findings:      Endoscopic Finding :      1. A small, submucosal, non-circumferential mass with no bleeding and no       stigmata of recent bleeding was found in the cardia. This was       approximately 1cm endoscopically. The overlying mucosa was normal.      2. UGI tract was otherwise normal.      Endosonographic Finding :      1. A round intramural (subepithelial) lesion was found in the cardia of       the stomach. The lesion was isoechoic. Sonographically, the lesion       appeared deep to the muscularis propria layer of the gastric wall,       likely involving the serosa. The lesion measured 19 mm (in maximum       thickness). The outer endosonographic borders were well defined. Fine       needle aspiration for cytology was performed. Color Doppler imaging was       utilized prior to needle puncture to confirm a lack of significant       vascular structures within the needle path. One pass was made with the       25 gauge needle using a transgastric  approach. No stylet was used. A       preliminary cytologic examination was not performed. Final cytology       results are pending.      2. The gastric wall was otherwise normal appearing.      3. Limited views of the liver, pancreas, spleen, portal and splenic       vessels were all normal. Impression:               1.9cm well circumscribed mass that appeared to                            involve the serosa of the gastric cardia (just deep                             to the muscularis propria). This was sampled with                            FNA, results pending. Ddx includes GIST, leiomyoma. Moderate Sedation:      N/A- Per Anesthesia Care Recommendation:           - Discharge patient to home (ambulatory).                           - If cytology is non-diagnostic, I will likely                            recommend interval surveillance EUS in 12-18 months. Procedure Code(s):        --- Professional ---                           [phone], 39, Esophagogastroduodenoscopy, flexible,                            transoral; with transendoscopic ultrasound-guided                            intramural or transmural fine needle                            aspiration/biopsy(s), (includes endoscopic                            ultrasound examination limited to the esophagus,                            stomach or duodenum, and adjacent structures) Diagnosis Code(s):        --- Professional ---                           D49.0, Neoplasm of unspecified behavior of                            digestive system                           K31.89, Other diseases of stomach and duodenum  R93.3, Abnormal findings on diagnostic imaging of                            other parts of digestive tract CPT copyright 2016 American Medical Association. All rights reserved. The codes documented in this report are preliminary and upon coder review may  be revised to meet current compliance requirements. [name], MD 07/16/2015 1:33:35 PM This report has been signed electronically. Number of Addenda: 0

## 2015-07-16 NOTE — Anesthesia Postprocedure Evaluation (Signed)
Anesthesia Post Note  Patient: Marcel Skramstad  Procedure(s) Performed: Procedure(s) (LRB): UPPER ENDOSCOPIC ULTRASOUND (EUS) RADIAL (N/A) ESOPHAGOGASTRODUODENOSCOPY (EGD) WITH PROPOFOL (N/A) FINE NEEDLE ASPIRATION (FNA) LINEAR (N/A)  Patient location during evaluation: PACU Anesthesia Type: MAC Level of consciousness: awake and alert Pain management: pain level controlled Vital Signs Assessment: post-procedure vital signs reviewed and stable Respiratory status: spontaneous breathing, nonlabored ventilation, respiratory function stable and patient connected to nasal cannula oxygen Cardiovascular status: stable and blood pressure returned to baseline Anesthetic complications: no    Last Vitals:  Filed Vitals:   07/16/15 1350 07/16/15 1355  BP: 125/104   Pulse: 86 80  Temp:    Resp: 17 13    Last Pain: There were no vitals filed for this visit.               Melah Ebling J

## 2015-07-16 NOTE — Transfer of Care (Signed)
Immediate Anesthesia Transfer of Care Note  Patient: Jonathan Gross  Procedure(s) Performed: Procedure(s): UPPER ENDOSCOPIC ULTRASOUND (EUS) RADIAL (N/A)  Patient Location: PACU and Endoscopy Unit  Anesthesia Type:MAC  Level of Consciousness: awake and patient cooperative  Airway & Oxygen Therapy: Patient Spontanous Breathing and Patient connected to nasal cannula oxygen  Post-op Assessment: Report given to RN and Post -op Vital signs reviewed and stable  Post vital signs: Reviewed and stable  Last Vitals:  Filed Vitals:   07/16/15 1215  BP: 130/88  Pulse: 87  Temp: 36.9 C  Resp: 13    Last Pain: There were no vitals filed for this visit.       Complications: No apparent anesthesia complications

## 2015-07-17 ENCOUNTER — Ambulatory Visit: Payer: Self-pay | Admitting: Internal Medicine

## 2015-08-14 ENCOUNTER — Other Ambulatory Visit: Payer: Self-pay | Admitting: Internal Medicine

## 2015-08-14 NOTE — Telephone Encounter (Signed)
Rx called in 

## 2015-08-14 NOTE — Telephone Encounter (Signed)
Okay to refill? 

## 2015-11-18 ENCOUNTER — Ambulatory Visit (INDEPENDENT_AMBULATORY_CARE_PROVIDER_SITE_OTHER): Payer: 59 | Admitting: Neurology

## 2015-11-18 ENCOUNTER — Encounter: Payer: Self-pay | Admitting: Neurology

## 2015-11-18 VITALS — BP 115/75 | HR 89 | Ht 67.0 in | Wt 200.2 lb

## 2015-11-18 DIAGNOSIS — R419 Unspecified symptoms and signs involving cognitive functions and awareness: Secondary | ICD-10-CM | POA: Diagnosis not present

## 2015-11-18 DIAGNOSIS — E538 Deficiency of other specified B group vitamins: Secondary | ICD-10-CM | POA: Diagnosis not present

## 2015-11-18 DIAGNOSIS — R413 Other amnesia: Secondary | ICD-10-CM | POA: Diagnosis not present

## 2015-11-18 NOTE — Progress Notes (Signed)
[facility]    Provider:  Dr Jaynee Eagles Referring Provider: Rachel Moulds, DO  CC:  Memory problems.   HPI:  Jonathan Gross is a 50 y.o. male here as a referral from Dr. Rachel Moulds, DO for memory changes. Past medical history of diabetes and anxiety. Patient has a history of depression, anxiety and possible ADHD and has been working with Dr. Rachel Moulds since 02/2014 for attention and memory struggles , patient was seen in February 2017 and at that time it was determined that his attention and concentration symptoms are likely due to anxiety and depression and not true neurological ADHD.He is being referred to rule out more serious underlying neurological issue.  Patient reports he is forgetting things. He was having a lot of stress last year and he tried medications, tried ADHD medications. He has worked through his anxiety with a therapist. But he continues to have forgetfulness, forgetting where he put things, forgetting special documents. He feels more absent minded, difficulty expressing himself, he is hyper sensitive to the ADHD medications and could not tolerate them. His son was diagnosed at the age of 3 and he is being treated for ADHD as well. His son took a test to see Dr. Johnnye Sima and patient had to do a test and that is when he noticed it as well and realized something was wrong and he went to see dr. Johnnye Sima and he had testing completed and he scored as ADHD. He has always been forgetful all his life and struggled in school and in college it was very bad, couldn;t get through a test. He feels his memory is even worse now, progressively worsening, his wife tells him things that he forgets. He has purchased the same book and didn;t realize he already read it. 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 He has worsening confusion, missing time and doesn;t remember things. He has had possible transient alteration of awareness. He ends of places he isn;t  supposed to be, memory problems, missing time.  No family history of Alzheimer's dementia or other degenerative neurocognitive disorder however he does describe his family members also is absent minded. He denies any alterations of consciousness however or staring spells or any seizure-like events in the past.   Reviewed notes, labs and imaging from outside physicians, which showed:  Reviewed notes from [facility]. Patient was seen in February 2017 and at that time it was determined that his attention, concentration symptoms were likely due to anxiety and depression and not true neurological ADHD. They have tried multiple stimulants both with and without antidepressants which she did not tolerate well. He has been seeing a therapist for cognitive therapy for about 10 months now and feels she has been very helpful. He continues to have problems with memory however more so with attention describes himself is flaky and missing and making careless mistakes in forgetful. He is constantly misplacing things. He gave an example that recently he was cleaning at home and discovered a packet she had ordered weeks ago it totally forgotten about it. He has been seeing the specialists for years, he has been off stimulants for approximately 8 months at last appointment. No family history of Alzheimer's dementia however he does refer to his family members is absent minded. Symptoms improved since starting cognitive behavioral therapy. He is being referred to rule out more serious underlying neurological issue.  Hemoglobin A1c 6.14 July 2015, BUN 15, creatinine 0.93 June 2017, TSH 0.990 October 2016.  Review of Systems: Patient complains of symptoms per HPI as well as the following symptoms: Joint pain, ringing in ears, memory loss, insomnia, racing thoughts. Pertinent negatives per HPI. All others negative.   Social History   Social History  . Marital status: Married    Spouse name: Seth Bake  .  Number of children: 2  . Years of education: College   Occupational History  . [employer]    Social History Main Topics  . Smoking status: Former Smoker    Quit date: 01/10/1998  . Smokeless tobacco: Never Used  . Alcohol use No  . Drug use: No  . Sexual activity: Not on file   Other Topics Concern  . Not on file   Social History Narrative   Lives with Jonathan Gross (wife)   Caffeine use: soda/coffee daily    Family History  Problem Relation Age of Onset  . Diabetes Mother   . Hypertension Sister   . Hyperlipidemia Sister   . Heart disease Sister     cabg age 26  . Colon cancer Neg Hx     Past Medical History:  Diagnosis Date  . ANXIETY 08/05/2008  . DIABETES MELLITUS, TYPE II 09/08/2006  . Family history of adverse reaction to anesthesia    father-has intubation issues-small throat, son-nausea and vomiting  . GERD 09/08/2006  . HYPERTENSION 09/08/2006  . PANIC DISORDER, HX OF 10/30/2006    Past Surgical History:  Procedure Laterality Date  . APPENDECTOMY  1997  . ESOPHAGOGASTRODUODENOSCOPY (EGD) WITH PROPOFOL N/A 07/16/2015   Procedure: ESOPHAGOGASTRODUODENOSCOPY (EGD) WITH PROPOFOL;  Surgeon: [name], MD;  Location: [facility];  Service: Endoscopy;  Laterality: N/A;  . EUS N/A 07/16/2015   Procedure: UPPER ENDOSCOPIC ULTRASOUND (EUS) RADIAL;  Surgeon: [name], MD;  Location: [facility];  Service: Endoscopy;  Laterality: N/A;  . FINE NEEDLE ASPIRATION N/A 07/16/2015   Procedure: FINE NEEDLE ASPIRATION (FNA) LINEAR;  Surgeon: [name], MD;  Location: [facility];  Service: Endoscopy;  Laterality: N/A;  . TOE AMPUTATION Right 2008   great and 2nd toe  . VASECTOMY  1999    Current Outpatient Prescriptions  Medication Sig Dispense Refill  . aspirin 81 MG tablet Take 81 mg by mouth daily.    Marland Kitchen atorvastatin (LIPITOR) 10 MG tablet Take 10 mg by mouth daily.     Marland Kitchen buPROPion (WELLBUTRIN XL) 300 MG 24 hr tablet Take 300 mg by mouth daily.  3  .  Cholecalciferol (VITAMIN D PO) Take 2 tablets by mouth daily.    Marland Kitchen lisinopril (PRINIVIL,ZESTRIL) 20 MG tablet Take 20 mg by mouth daily.    Marland Kitchen LORazepam (ATIVAN) 0.5 MG tablet TAKE 2 TABLETS BY MOUTH TWICE A DAY AS NEEDED 180 tablet 0  . Multiple Vitamin (MULTIVITAMIN) tablet Take 1 tablet by mouth daily.    . Omega-3 Fatty Acids (FISH OIL PO) Take 1,200 mg by mouth daily.    . TRULICITY 1.5 0000000 SOPN Inject 1.5 mg into the skin once a week. Sundays    . XIGDUO XR 05-998 MG TB24 Take 2 tablets by mouth daily.  2   No current facility-administered medications for this visit.     Allergies as of 11/18/2015 - Review Complete 11/18/2015  Allergen Reaction Noted  . Amoxicillin Rash 08/10/2012    Vitals: BP 115/75 (BP Location: Right Arm, Patient Position: Sitting, Cuff Size: Normal)   Pulse 89   Ht 5\' 7"  (1.702 m)   Wt 200 lb 3.2 oz (  90.8 kg)   BMI 31.36 kg/m  Last Weight:  Wt Readings from Last 1 Encounters:  11/18/15 200 lb 3.2 oz (90.8 kg)   Last Height:   Ht Readings from Last 1 Encounters:  11/18/15 5\' 7"  (1.702 m)    Physical exam: Exam: Gen: NAD, conversant, well nourised, obese, well groomed                     CV: RRR, no MRG. No Carotid Bruits. No peripheral edema, warm, nontender Eyes: Conjunctivae clear without exudates or hemorrhage  Neuro: Detailed Neurologic Exam  Speech:    Speech is normal; fluent and spontaneous with normal comprehension.  Cognition:  MMSE - Mini Mental State Exam 11/18/2015  Orientation to time 5  Orientation to Place 5  Registration 3  Attention/ Calculation 5  Recall 1  Language- name 2 objects 2  Language- repeat 1  Language- follow 3 step command 3  Language- read & follow direction 1  Write a sentence 1  Copy design 1  Total score 28      The patient is oriented to person, place, and time;     recent and remote memory intact;     language fluent;     normal attention, concentration,     fund of knowledge Cranial  Nerves:    The pupils are equal, round, and reactive to light. The fundi are normal and spontaneous venous pulsations are present. Visual fields are full to finger confrontation. Extraocular movements are intact. Trigeminal sensation is intact and the muscles of mastication are normal. The face is symmetric. The palate elevates in the midline. Hearing intact. Voice is normal. Shoulder shrug is normal. The tongue has normal motion without fasciculations.   Coordination:    Normal finger to nose and heel to shin. Normal rapid alternating movements.   Gait:    Heel-toe and tandem gait are normal.   Motor Observation:    No asymmetry, no atrophy, and no involuntary movements noted. Tone:    Normal muscle tone.    Posture:    Posture is normal. normal erect    Strength:    Strength is V/V in the upper and lower limbs.      Sensation: intact to LT     Reflex Exam:  DTR's:    Deep tendon reflexes in the upper and lower extremities are normal bilaterally.   Toes:    The toes are downgoing bilaterally.   Clonus:    Clonus is absent.      Assessment/Plan:  This is a 50 year old male with chronic complaints of mostly inattentiveness along with poor memory. He has a significant history of depression and anxiety. He has been working very closely with therapist for cognitive behavioral therapy which has helped, and he was trialed on multiple ADHD medications by Dr. Rachel Moulds who does not at this point think his complaints are due to ADHD but moreso mood disorder. He is being referred to rule out more serious underlying neurological issue. Neurologic exam is normal.   It is likely that patient's cognitive symptoms are due to his mood disorder in addition to normal cognitive aging. However given his symptoms would like to order an MRI of the brain with and without contrast to rule out any intracranial abnormalities, multiple sclerosis, strokes, brain atrophy or other causes for his symptoms  given chronicity and progressive worsening as well as B12 and TSH. If MRI of the brain is unremarkable I do recommend continued  follow-up with therapy and psychiatry.   Cc: Rachel Moulds, DO  [facility], [facility] [address] [place], [place] [address]  Phone [phone] Fax [phone]

## 2015-11-18 NOTE — Patient Instructions (Signed)
Remember to drink plenty of fluid, eat healthy meals and do not skip any meals. Try to eat protein with a every meal and eat a healthy snack such as fruit or nuts in between meals. Try to keep a regular sleep-wake schedule and try to exercise daily, particularly in the form of walking, 20-30 minutes a day, if you can.   As far as diagnostic testing: Labs, MRI of the brain  I would like to see you back as needed, sooner if we need to. Please call us with any interim questions, concerns, problems, updates or refill requests.   Our phone number is [phone]. We also have an after hours call service for urgent matters and there is a physician on-call for urgent questions. For any emergencies you know to call 911 or go to the nearest emergency room

## 2015-11-19 ENCOUNTER — Telehealth: Payer: Self-pay | Admitting: *Deleted

## 2015-11-19 LAB — BASIC METABOLIC PANEL
BUN/Creatinine Ratio: 17 (ref 9–20)
BUN: 14 mg/dL (ref 6–24)
CALCIUM: 9.4 mg/dL (ref 8.7–10.2)
CHLORIDE: 101 mmol/L (ref 96–106)
CO2: 25 mmol/L (ref 18–29)
Creatinine, Ser: 0.83 mg/dL (ref 0.76–1.27)
GFR calc non Af Amer: 102 mL/min/{1.73_m2} (ref >59)
GFR, EST AFRICAN AMERICAN: 118 mL/min/{1.73_m2} (ref >59)
Glucose: 138 mg/dL — ABNORMAL HIGH (ref 65–99)
Potassium: 4.3 mmol/L (ref 3.5–5.2)
Sodium: 142 mmol/L (ref 134–144)

## 2015-11-19 LAB — B12 AND FOLATE PANEL
Folate: 18.1 ng/mL (ref >3.0)
Vitamin B-12: 477 pg/mL (ref 211–946)

## 2015-11-19 LAB — TSH: TSH: 1.5 u[IU]/mL (ref 0.450–4.500)

## 2015-11-19 NOTE — Telephone Encounter (Signed)
-----   Message from Melvenia Beam, MD sent at 11/19/2015 10:13 AM EST ----- Labs normal except elevated glucose 138 but this was not a fasting lab so may not be abnormal thanks

## 2015-11-19 NOTE — Telephone Encounter (Signed)
Called and spoke to pt about lab results per AA,MD note. He verbalized understanding.

## 2015-12-06 ENCOUNTER — Ambulatory Visit
Admission: RE | Admit: 2015-12-06 | Discharge: 2015-12-06 | Disposition: A | Discharge status: Home / Self Care | Payer: 59 | Source: Ambulatory Visit | Attending: Neurology | Admitting: Neurology

## 2015-12-06 DIAGNOSIS — R419 Unspecified symptoms and signs involving cognitive functions and awareness: Secondary | ICD-10-CM

## 2015-12-06 DIAGNOSIS — R413 Other amnesia: Secondary | ICD-10-CM

## 2015-12-06 MED ORDER — GADOBENATE DIMEGLUMINE 529 MG/ML IV SOLN
18.0000 mL | Freq: Once | INTRAVENOUS | Status: AC | PRN
  Administered 2015-12-06: 18 mL via INTRAVENOUS

## 2015-12-08 ENCOUNTER — Telehealth: Payer: Self-pay | Admitting: *Deleted

## 2015-12-08 NOTE — Telephone Encounter (Signed)
LVM for pt about MRI results per AA,MD note. Gave [facility] phone number if he has further questions.

## 2015-12-08 NOTE — Telephone Encounter (Signed)
-----   Message from Melvenia Beam, MD sent at 12/08/2015  5:30 PM EST ----- MRI of the brain normal for age thanks

## 2016-05-11 ENCOUNTER — Telehealth: Payer: Self-pay

## 2016-05-11 NOTE — Telephone Encounter (Signed)
Staff message to call pt in July for recall EUS

## 2016-05-11 NOTE — Telephone Encounter (Signed)
Per recall pt needs upper EUS r/l mac next available Thursday in July for gastric mass

## 2016-05-16 DIAGNOSIS — E1165 Type 2 diabetes mellitus with hyperglycemia: Secondary | ICD-10-CM | POA: Diagnosis not present

## 2016-06-07 DIAGNOSIS — E1165 Type 2 diabetes mellitus with hyperglycemia: Secondary | ICD-10-CM | POA: Diagnosis not present

## 2016-08-19 DIAGNOSIS — E119 Type 2 diabetes mellitus without complications: Secondary | ICD-10-CM | POA: Diagnosis not present

## 2016-09-06 DIAGNOSIS — Z5181 Encounter for therapeutic drug level monitoring: Secondary | ICD-10-CM | POA: Diagnosis not present

## 2016-09-06 DIAGNOSIS — Z79899 Other long term (current) drug therapy: Secondary | ICD-10-CM | POA: Diagnosis not present

## 2016-09-06 DIAGNOSIS — E119 Type 2 diabetes mellitus without complications: Secondary | ICD-10-CM | POA: Diagnosis not present

## 2016-09-06 DIAGNOSIS — E669 Obesity, unspecified: Secondary | ICD-10-CM | POA: Diagnosis not present

## 2016-12-08 DIAGNOSIS — Z5181 Encounter for therapeutic drug level monitoring: Secondary | ICD-10-CM | POA: Diagnosis not present

## 2016-12-08 DIAGNOSIS — E669 Obesity, unspecified: Secondary | ICD-10-CM | POA: Diagnosis not present

## 2016-12-08 DIAGNOSIS — E119 Type 2 diabetes mellitus without complications: Secondary | ICD-10-CM | POA: Diagnosis not present

## 2017-02-16 DIAGNOSIS — J329 Chronic sinusitis, unspecified: Secondary | ICD-10-CM | POA: Diagnosis not present

## 2017-02-16 DIAGNOSIS — F419 Anxiety disorder, unspecified: Secondary | ICD-10-CM | POA: Diagnosis not present

## 2017-04-08 IMAGING — CT CT ABD-PELV W/ CM
2 of 5 series · 16 of 46 positions shown, 18 images · IV contrast (ISOVUE 300)
Comparison: None.

CLINICAL DATA: Abnormal colonoscopy, possible nodule at appendiceal
orifice

EXAM:
CT ABDOMEN AND PELVIS WITH CONTRAST
TECHNIQUE: Multidetector CT imaging of the abdomen and pelvis was performed
using the standard protocol following bolus administration of
intravenous contrast.
CONTRAST:  100mL [id number] IOPAMIDOL ([id number]) INJECTION 61%

[Series 2: abd/ pelvis · axial · 0.81mm/px · z∈[-568,-138]mm · 13 of 98 slices shown, 15 images]
[im 6/98  soft-tissue]
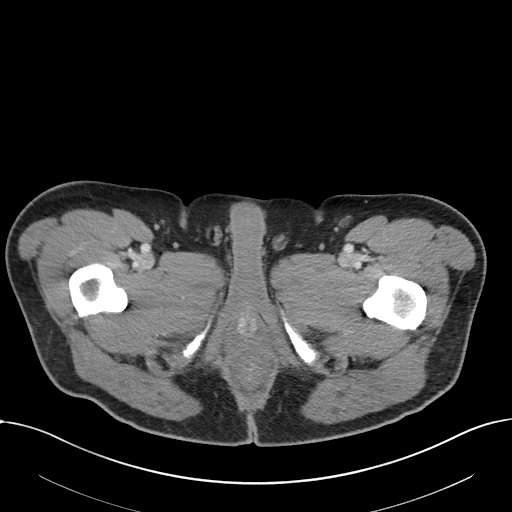
[im 6/98  bone]
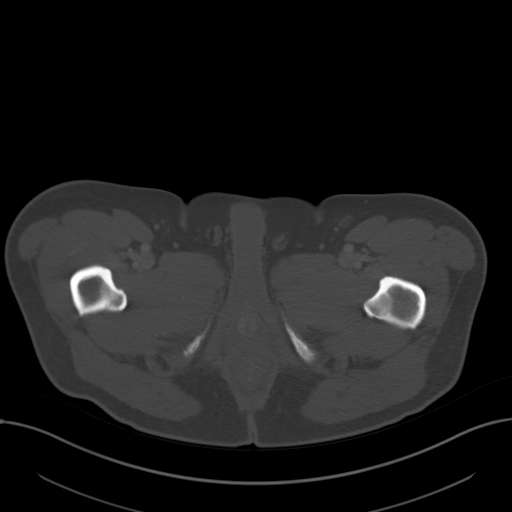
[im 16/98  soft-tissue]
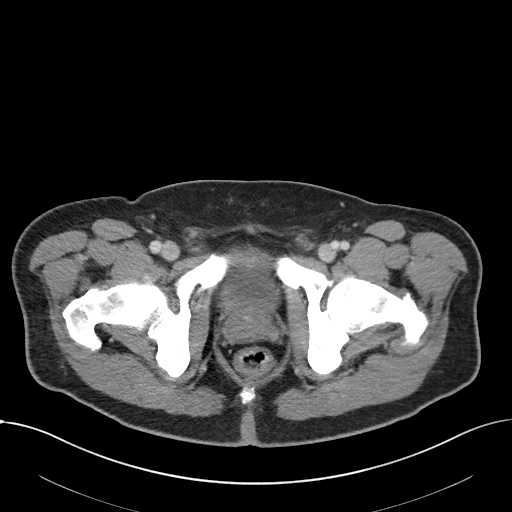
[im 21/98  soft-tissue]
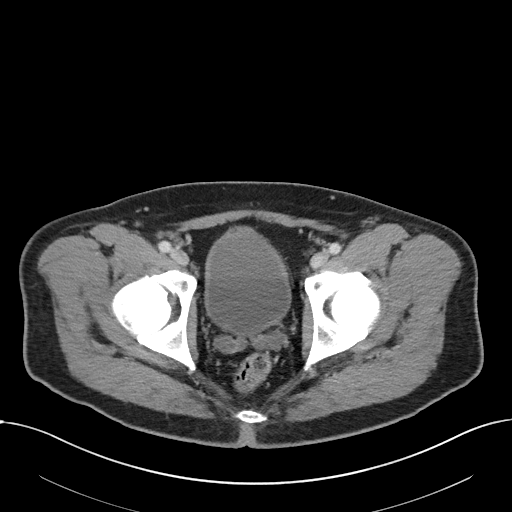
[im 26/98  soft-tissue]
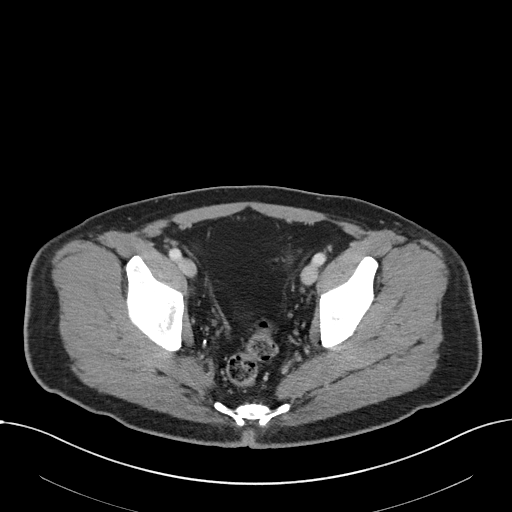
[im 36/98  soft-tissue]
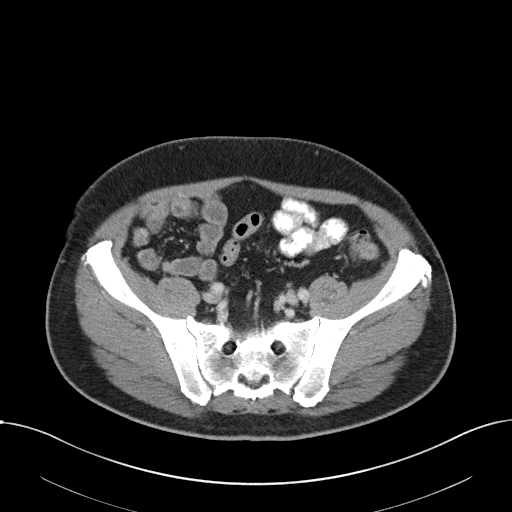
[im 41/98  soft-tissue]
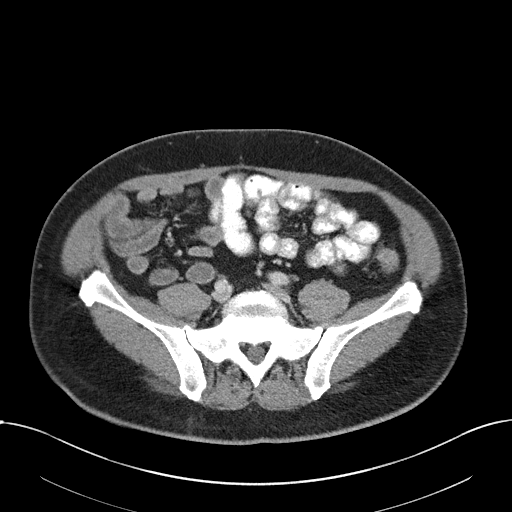
[im 52/98  soft-tissue]
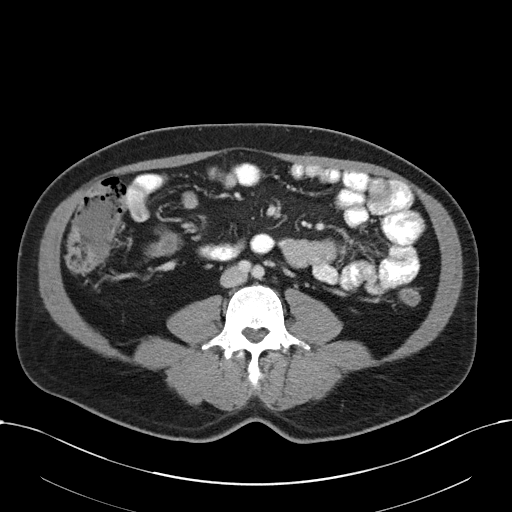
[im 57/98  soft-tissue]
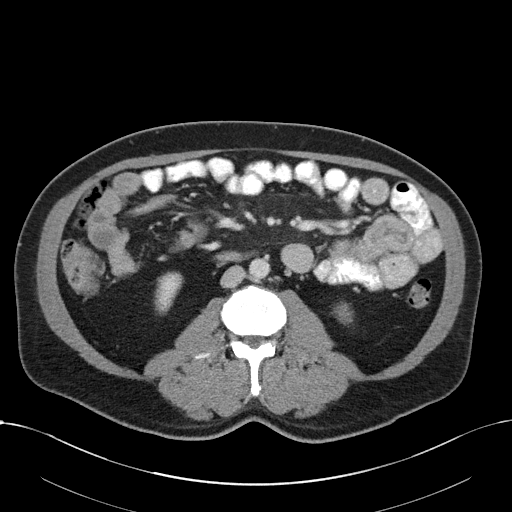
[im 62/98  soft-tissue]
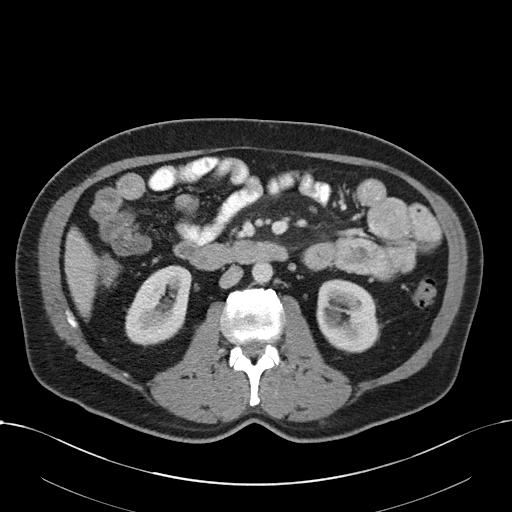
[im 62/98  bone]
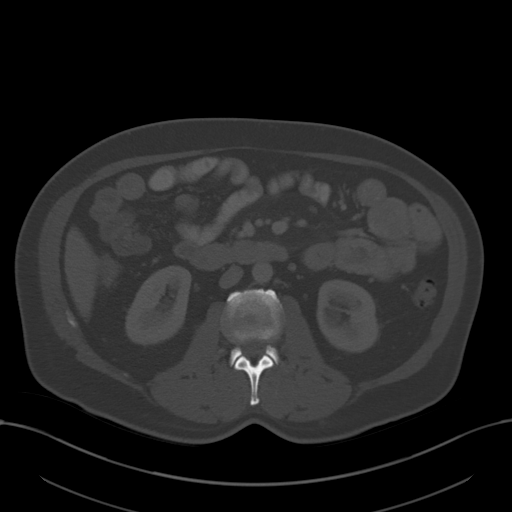
[im 72/98  soft-tissue]
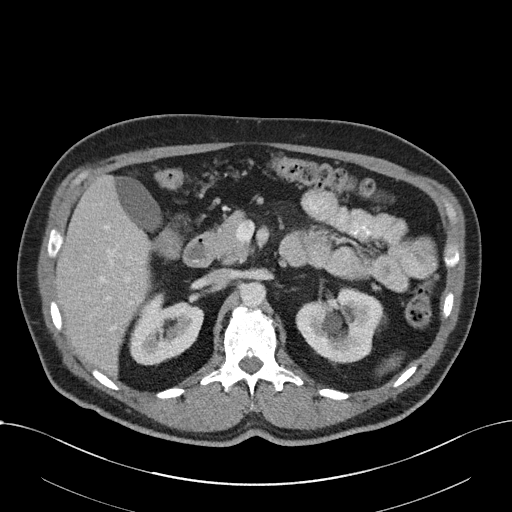
[im 77/98  soft-tissue]
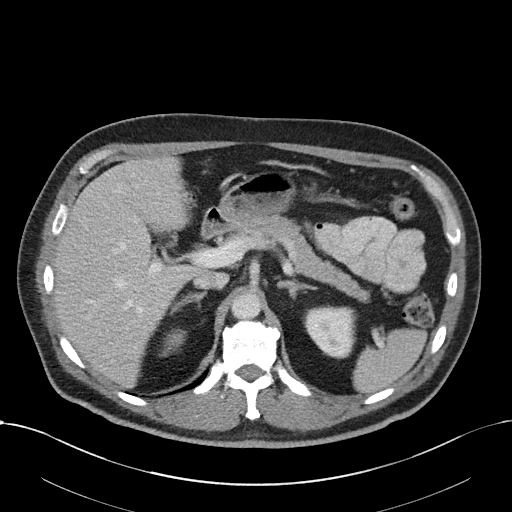
[im 82/98  soft-tissue]
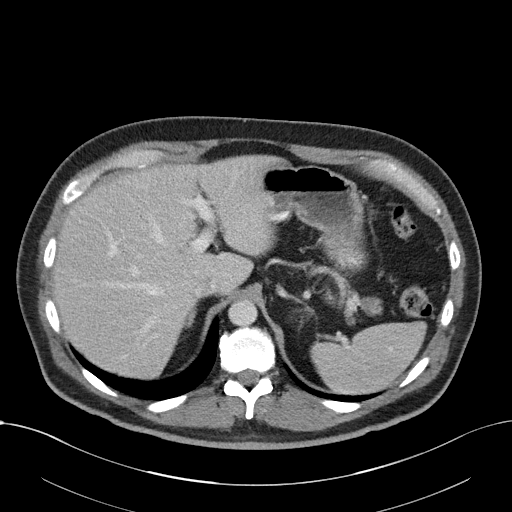
[im 92/98  soft-tissue]
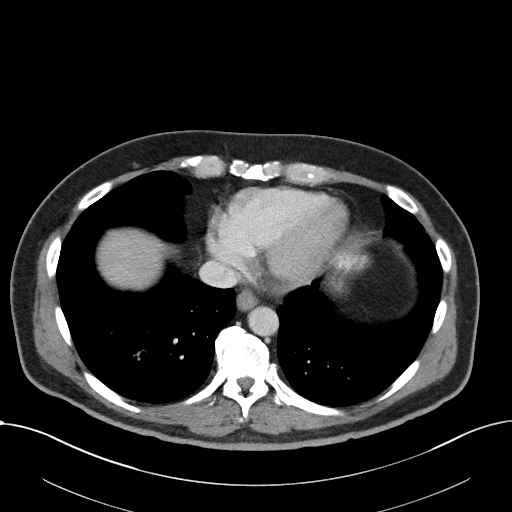

[Series 5: coronal soft tissue · coronal · 0.73mm/px · 3 of 86 slices shown]
[im 29/86  soft-tissue]
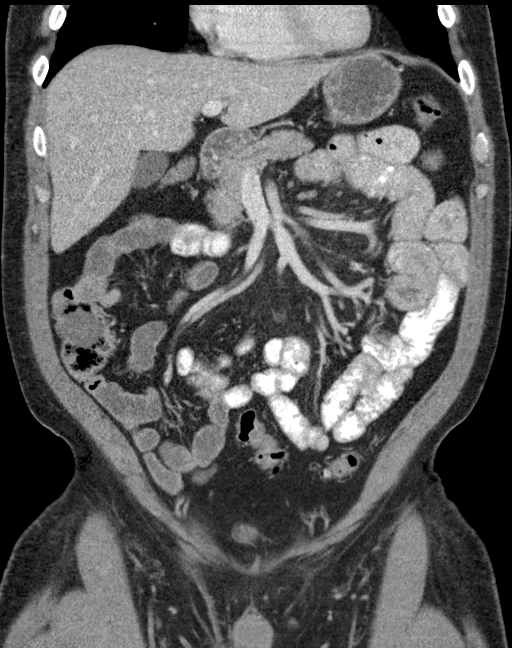
[im 38/86  soft-tissue]
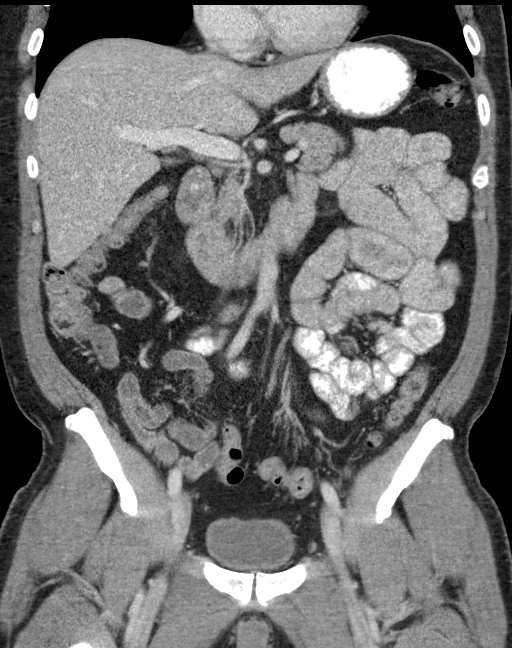
[im 48/86  soft-tissue]
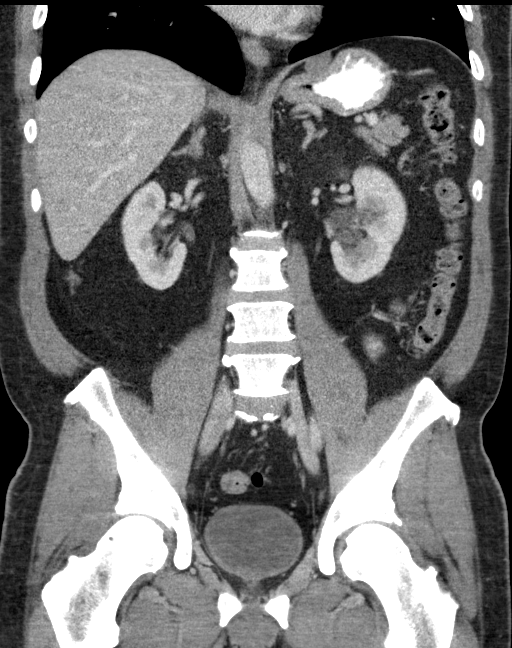

[16 of 46 positions shown; findings below may reference images not displayed]

FINDINGS: Lower chest:  Lung bases are clear.

Hepatobiliary: 6 mm hypoenhancing lesion in the right hepatic dome
(series 2/image 11), possibly reflecting a tiny cyst. 1.8 cm
probable hemangioma in segment 6 (series 2/image 20).

Gallbladder is unremarkable. No intrahepatic or extrahepatic ductal
dilatation.

Pancreas: Within normal limits.

Spleen: Within normal limits.

Adrenals/Urinary Tract: Adrenal glands are within normal limits.

Left renal sinus cyst. Right kidney is within normal limits. No
hydronephrosis.

Bladder is within normal limits.

Stomach/Bowel: 1.8 x 1.6 cm soft tissue lesion adjacent to the
gastric cardia (series 2/ image 10). On coronal imaging, lesion
appears subserosal or extra mucosal.

No evidence of bowel obstruction.

Prior appendectomy. No colonic mass or CT abnormality is seen in the
expected location of the appendiceal orifice (series 2/image 54).

Mild sigmoid diverticulosis, without evidence of diverticulitis.

Vascular/Lymphatic: No evidence of abdominal aortic aneurysm. Mild
atherosclerotic calcifications.

No suspicious abdominopelvic lymphadenopathy. Nonspecific jejunal
mesentery stranding with small lymph nodes measuring up to 7 mm
short axis (series 2/ image 29), within normal limits.

Reproductive: Prostate is grossly unremarkable.

Other: No abdominopelvic ascites.

Small fat containing periumbilical hernia.

Musculoskeletal: Mild degenerative changes the visualized
thoracolumbar spine.
IMPRESSION: Prior appendectomy. No colonic mass or CT abnormality is seen in the
expected location of the appendiceal orifice.

1.8 x 1.6 cm soft tissue lesion adjacent to the gastric cardia,
possibly subserosal or extra mucosal. Primary differential
considerations include gastric GIST or leiomyoma. Consider EUS.

## 2018-03-27 DIAGNOSIS — M545 Low back pain: Secondary | ICD-10-CM | POA: Diagnosis not present

## 2018-03-27 DIAGNOSIS — S39012D Strain of muscle, fascia and tendon of lower back, subsequent encounter: Secondary | ICD-10-CM | POA: Diagnosis not present

## 2018-04-02 DIAGNOSIS — S39012D Strain of muscle, fascia and tendon of lower back, subsequent encounter: Secondary | ICD-10-CM | POA: Diagnosis not present

## 2018-04-04 DIAGNOSIS — S39012D Strain of muscle, fascia and tendon of lower back, subsequent encounter: Secondary | ICD-10-CM | POA: Diagnosis not present

## 2018-04-10 DIAGNOSIS — S39012D Strain of muscle, fascia and tendon of lower back, subsequent encounter: Secondary | ICD-10-CM | POA: Diagnosis not present

## 2018-04-24 DIAGNOSIS — M9901 Segmental and somatic dysfunction of cervical region: Secondary | ICD-10-CM | POA: Diagnosis not present

## 2018-04-24 DIAGNOSIS — M62830 Muscle spasm of back: Secondary | ICD-10-CM | POA: Diagnosis not present

## 2018-04-24 DIAGNOSIS — M546 Pain in thoracic spine: Secondary | ICD-10-CM | POA: Diagnosis not present

## 2018-04-24 DIAGNOSIS — M9902 Segmental and somatic dysfunction of thoracic region: Secondary | ICD-10-CM | POA: Diagnosis not present

## 2018-04-25 DIAGNOSIS — M62830 Muscle spasm of back: Secondary | ICD-10-CM | POA: Diagnosis not present

## 2018-04-25 DIAGNOSIS — M9902 Segmental and somatic dysfunction of thoracic region: Secondary | ICD-10-CM | POA: Diagnosis not present

## 2018-04-25 DIAGNOSIS — M546 Pain in thoracic spine: Secondary | ICD-10-CM | POA: Diagnosis not present

## 2018-04-25 DIAGNOSIS — M9901 Segmental and somatic dysfunction of cervical region: Secondary | ICD-10-CM | POA: Diagnosis not present

## 2018-04-26 DIAGNOSIS — M62830 Muscle spasm of back: Secondary | ICD-10-CM | POA: Diagnosis not present

## 2018-04-26 DIAGNOSIS — M546 Pain in thoracic spine: Secondary | ICD-10-CM | POA: Diagnosis not present

## 2018-04-26 DIAGNOSIS — M9901 Segmental and somatic dysfunction of cervical region: Secondary | ICD-10-CM | POA: Diagnosis not present

## 2018-04-26 DIAGNOSIS — M9902 Segmental and somatic dysfunction of thoracic region: Secondary | ICD-10-CM | POA: Diagnosis not present

## 2018-04-30 DIAGNOSIS — M9902 Segmental and somatic dysfunction of thoracic region: Secondary | ICD-10-CM | POA: Diagnosis not present

## 2018-04-30 DIAGNOSIS — M9901 Segmental and somatic dysfunction of cervical region: Secondary | ICD-10-CM | POA: Diagnosis not present

## 2018-04-30 DIAGNOSIS — M546 Pain in thoracic spine: Secondary | ICD-10-CM | POA: Diagnosis not present

## 2018-04-30 DIAGNOSIS — M62830 Muscle spasm of back: Secondary | ICD-10-CM | POA: Diagnosis not present

## 2018-05-02 DIAGNOSIS — M9901 Segmental and somatic dysfunction of cervical region: Secondary | ICD-10-CM | POA: Diagnosis not present

## 2018-05-02 DIAGNOSIS — M62830 Muscle spasm of back: Secondary | ICD-10-CM | POA: Diagnosis not present

## 2018-05-02 DIAGNOSIS — M9902 Segmental and somatic dysfunction of thoracic region: Secondary | ICD-10-CM | POA: Diagnosis not present

## 2018-05-02 DIAGNOSIS — M546 Pain in thoracic spine: Secondary | ICD-10-CM | POA: Diagnosis not present

## 2018-05-03 DIAGNOSIS — M9901 Segmental and somatic dysfunction of cervical region: Secondary | ICD-10-CM | POA: Diagnosis not present

## 2018-05-03 DIAGNOSIS — M9902 Segmental and somatic dysfunction of thoracic region: Secondary | ICD-10-CM | POA: Diagnosis not present

## 2018-05-03 DIAGNOSIS — M546 Pain in thoracic spine: Secondary | ICD-10-CM | POA: Diagnosis not present

## 2018-05-03 DIAGNOSIS — M62830 Muscle spasm of back: Secondary | ICD-10-CM | POA: Diagnosis not present

## 2018-05-07 DIAGNOSIS — M546 Pain in thoracic spine: Secondary | ICD-10-CM | POA: Diagnosis not present

## 2018-05-07 DIAGNOSIS — M9901 Segmental and somatic dysfunction of cervical region: Secondary | ICD-10-CM | POA: Diagnosis not present

## 2018-05-07 DIAGNOSIS — M9902 Segmental and somatic dysfunction of thoracic region: Secondary | ICD-10-CM | POA: Diagnosis not present

## 2018-05-07 DIAGNOSIS — M62830 Muscle spasm of back: Secondary | ICD-10-CM | POA: Diagnosis not present

## 2018-05-08 DIAGNOSIS — M9901 Segmental and somatic dysfunction of cervical region: Secondary | ICD-10-CM | POA: Diagnosis not present

## 2018-05-08 DIAGNOSIS — M9902 Segmental and somatic dysfunction of thoracic region: Secondary | ICD-10-CM | POA: Diagnosis not present

## 2018-05-08 DIAGNOSIS — M62830 Muscle spasm of back: Secondary | ICD-10-CM | POA: Diagnosis not present

## 2018-05-08 DIAGNOSIS — M546 Pain in thoracic spine: Secondary | ICD-10-CM | POA: Diagnosis not present

## 2018-05-10 DIAGNOSIS — M9902 Segmental and somatic dysfunction of thoracic region: Secondary | ICD-10-CM | POA: Diagnosis not present

## 2018-05-10 DIAGNOSIS — M62830 Muscle spasm of back: Secondary | ICD-10-CM | POA: Diagnosis not present

## 2018-05-10 DIAGNOSIS — M546 Pain in thoracic spine: Secondary | ICD-10-CM | POA: Diagnosis not present

## 2018-05-10 DIAGNOSIS — M9901 Segmental and somatic dysfunction of cervical region: Secondary | ICD-10-CM | POA: Diagnosis not present

## 2018-05-14 DIAGNOSIS — M546 Pain in thoracic spine: Secondary | ICD-10-CM | POA: Diagnosis not present

## 2018-05-14 DIAGNOSIS — M62830 Muscle spasm of back: Secondary | ICD-10-CM | POA: Diagnosis not present

## 2018-05-14 DIAGNOSIS — M9901 Segmental and somatic dysfunction of cervical region: Secondary | ICD-10-CM | POA: Diagnosis not present

## 2018-05-14 DIAGNOSIS — M9902 Segmental and somatic dysfunction of thoracic region: Secondary | ICD-10-CM | POA: Diagnosis not present

## 2018-05-15 DIAGNOSIS — M546 Pain in thoracic spine: Secondary | ICD-10-CM | POA: Diagnosis not present

## 2018-05-15 DIAGNOSIS — M9901 Segmental and somatic dysfunction of cervical region: Secondary | ICD-10-CM | POA: Diagnosis not present

## 2018-05-15 DIAGNOSIS — M62830 Muscle spasm of back: Secondary | ICD-10-CM | POA: Diagnosis not present

## 2018-05-15 DIAGNOSIS — M9902 Segmental and somatic dysfunction of thoracic region: Secondary | ICD-10-CM | POA: Diagnosis not present

## 2018-05-17 DIAGNOSIS — M62830 Muscle spasm of back: Secondary | ICD-10-CM | POA: Diagnosis not present

## 2018-05-17 DIAGNOSIS — M9901 Segmental and somatic dysfunction of cervical region: Secondary | ICD-10-CM | POA: Diagnosis not present

## 2018-05-17 DIAGNOSIS — M546 Pain in thoracic spine: Secondary | ICD-10-CM | POA: Diagnosis not present

## 2018-05-17 DIAGNOSIS — M9902 Segmental and somatic dysfunction of thoracic region: Secondary | ICD-10-CM | POA: Diagnosis not present

## 2018-05-21 DIAGNOSIS — M9901 Segmental and somatic dysfunction of cervical region: Secondary | ICD-10-CM | POA: Diagnosis not present

## 2018-05-21 DIAGNOSIS — M62830 Muscle spasm of back: Secondary | ICD-10-CM | POA: Diagnosis not present

## 2018-05-21 DIAGNOSIS — M546 Pain in thoracic spine: Secondary | ICD-10-CM | POA: Diagnosis not present

## 2018-05-21 DIAGNOSIS — M9902 Segmental and somatic dysfunction of thoracic region: Secondary | ICD-10-CM | POA: Diagnosis not present

## 2018-10-13 DIAGNOSIS — Z23 Encounter for immunization: Secondary | ICD-10-CM | POA: Diagnosis not present

## 2018-10-25 DIAGNOSIS — E119 Type 2 diabetes mellitus without complications: Secondary | ICD-10-CM | POA: Diagnosis not present

## 2018-11-09 DIAGNOSIS — E119 Type 2 diabetes mellitus without complications: Secondary | ICD-10-CM | POA: Diagnosis not present

## 2018-11-09 DIAGNOSIS — Z5181 Encounter for therapeutic drug level monitoring: Secondary | ICD-10-CM | POA: Diagnosis not present

## 2018-11-09 DIAGNOSIS — E663 Overweight: Secondary | ICD-10-CM | POA: Diagnosis not present

## 2019-01-26 DIAGNOSIS — Z20828 Contact with and (suspected) exposure to other viral communicable diseases: Secondary | ICD-10-CM | POA: Diagnosis not present

## 2019-01-29 ENCOUNTER — Other Ambulatory Visit: Payer: Self-pay

## 2019-02-05 DIAGNOSIS — Z20828 Contact with and (suspected) exposure to other viral communicable diseases: Secondary | ICD-10-CM | POA: Diagnosis not present

## 2019-02-11 ENCOUNTER — Encounter: Payer: Self-pay | Admitting: Family Medicine

## 2019-02-11 DIAGNOSIS — Z125 Encounter for screening for malignant neoplasm of prostate: Secondary | ICD-10-CM | POA: Diagnosis not present

## 2019-02-11 DIAGNOSIS — E7800 Pure hypercholesterolemia, unspecified: Secondary | ICD-10-CM | POA: Diagnosis not present

## 2019-02-11 DIAGNOSIS — Z0000 Encounter for general adult medical examination without abnormal findings: Secondary | ICD-10-CM | POA: Diagnosis not present

## 2019-02-11 DIAGNOSIS — I10 Essential (primary) hypertension: Secondary | ICD-10-CM | POA: Diagnosis not present

## 2019-02-11 DIAGNOSIS — E1169 Type 2 diabetes mellitus with other specified complication: Secondary | ICD-10-CM | POA: Diagnosis not present

## 2019-02-11 LAB — LIPID PANEL
Cholesterol: 153 (ref 0–200)
HDL: 64 (ref 35–70)
LDL Cholesterol: 73
Triglycerides: 83 (ref 40–160)

## 2019-02-11 LAB — HEMOGLOBIN A1C: Hemoglobin A1C: 7

## 2019-03-22 ENCOUNTER — Ambulatory Visit: Payer: Self-pay | Admitting: Family Medicine

## 2019-06-27 ENCOUNTER — Other Ambulatory Visit: Payer: Self-pay

## 2019-06-28 ENCOUNTER — Encounter: Payer: Self-pay | Admitting: Family Medicine

## 2019-06-28 ENCOUNTER — Ambulatory Visit (INDEPENDENT_AMBULATORY_CARE_PROVIDER_SITE_OTHER): Payer: BC Managed Care – PPO | Admitting: Family Medicine

## 2019-06-28 VITALS — BP 112/70 | HR 104 | Temp 98.0°F | Ht 67.0 in | Wt 190.0 lb

## 2019-06-28 DIAGNOSIS — F900 Attention-deficit hyperactivity disorder, predominantly inattentive type: Secondary | ICD-10-CM | POA: Diagnosis not present

## 2019-06-28 DIAGNOSIS — F411 Generalized anxiety disorder: Secondary | ICD-10-CM | POA: Diagnosis not present

## 2019-06-28 DIAGNOSIS — E1169 Type 2 diabetes mellitus with other specified complication: Secondary | ICD-10-CM

## 2019-06-28 DIAGNOSIS — E118 Type 2 diabetes mellitus with unspecified complications: Secondary | ICD-10-CM

## 2019-06-28 DIAGNOSIS — I10 Essential (primary) hypertension: Secondary | ICD-10-CM

## 2019-06-28 DIAGNOSIS — E785 Hyperlipidemia, unspecified: Secondary | ICD-10-CM

## 2019-06-28 DIAGNOSIS — Z1159 Encounter for screening for other viral diseases: Secondary | ICD-10-CM

## 2019-06-28 DIAGNOSIS — Z114 Encounter for screening for human immunodeficiency virus [HIV]: Secondary | ICD-10-CM

## 2019-06-28 DIAGNOSIS — Z794 Long term (current) use of insulin: Secondary | ICD-10-CM

## 2019-06-28 MED ORDER — OZEMPIC (1 MG/DOSE) 4 MG/3ML ~~LOC~~ SOPN: 1.0000 mg | PEN_INJECTOR | SUBCUTANEOUS | 5 refills | Status: DC

## 2019-06-28 MED ORDER — LORAZEPAM 0.5 MG PO TABS: ORAL_TABLET | ORAL | 1 refills | Status: DC

## 2019-06-28 NOTE — Progress Notes (Signed)
Jonathan Gross DOB: [birth year] Encounter date: 06/28/2019  This isa 54 y.o. male who presents to establish care. Chief Complaint  Patient presents with  . Establish Care    History of present illness:  Day to day stress - new job; changeover with computer system. Doesn't like not being in control; not knowing what to do with new job.  He was let go prior to his desired age of retirement from his previous job, and has been pushed into going back to work, which she did not want to do.  He struggles with the idea of not having time to take care of his family and aging parents, and trying to learn a new job.  Has always struggled with stress/anxiety. Was treated for years and did see therapist here; sessions made him frustrated/angry. Was treated for ADHD 5-8 years ago; couldn't get meds to work; seemed to act in reverse. Mind racing. He did have testing for tolerance of meds and didn't react well for any meds. Tried to do supplements to help self. Ended up just quitting all medications. Lorazepam he takes 2-3 times/week. First panic attack - started with pain in gut that radiated up into chest. Was at work. Passed out. Ambulance was called. Can feel when panic attacks come on but is able to control with deep breathing, walking around, changing focus. Does still have chest pain.   Has been on the lisinopril 20mg  and does well with this.   DMII: when he is strict and taking insulin as he is supposed to and not doing late night eating sugars are usually pretty good. Usually higher in the mornings. Has freestyle meter. This week has been worse due to anxiety/stress. Just getting back to regular diet. (in mornings 170-230) (can get hypoglyemia in evenings). He is worried about kidney function and strain on kidneys. Cousin had issue on the synjardy.   Weight is up 5lbs. Working all the time; no time to exercise.   HL: lipitor 10mg  daily   Past Medical History:  Diagnosis Date  . ANXIETY 08/05/2008   . DIABETES MELLITUS, TYPE II 09/08/2006  . Family history of adverse reaction to anesthesia    father-has intubation issues-small throat, son-nausea and vomiting  . GERD 09/08/2006  . HYPERTENSION 09/08/2006  . PANIC DISORDER, HX OF 10/30/2006   Past Surgical History:  Procedure Laterality Date  . APPENDECTOMY  1997  . ESOPHAGOGASTRODUODENOSCOPY (EGD) WITH PROPOFOL N/A 07/16/2015   Procedure: ESOPHAGOGASTRODUODENOSCOPY (EGD) WITH PROPOFOL;  Surgeon: [name], MD;  Location: [facility];  Service: Endoscopy;  Laterality: N/A;  . EUS N/A 07/16/2015   Procedure: UPPER ENDOSCOPIC ULTRASOUND (EUS) RADIAL;  Surgeon: [name], MD;  Location: [facility];  Service: Endoscopy;  Laterality: N/A;  . FINE NEEDLE ASPIRATION N/A 07/16/2015   Procedure: FINE NEEDLE ASPIRATION (FNA) LINEAR;  Surgeon: [name], MD;  Location: [facility];  Service: Endoscopy;  Laterality: N/A;  . TOE AMPUTATION Right 2008   great and 2nd toe  . VASECTOMY  1999   Allergies  Allergen Reactions  . Amoxicillin Rash   Current Meds  Medication Sig  . aspirin 81 MG tablet Take 81 mg by mouth daily.  Marland Kitchen atorvastatin (LIPITOR) 10 MG tablet Take 10 mg by mouth daily.   . Continuous Blood Gluc Sensor (FREESTYLE LIBRE 14 DAY SENSOR) MISC by Does not apply route.  . insulin glargine (LANTUS) 100 UNIT/ML injection Inject 18 Units into the skin daily.  . Insulin Pen Needle (PEN NEEDLES)  32G X 4 MM MISC by Does not apply route.  Marland Kitchen lisinopril (PRINIVIL,ZESTRIL) 20 MG tablet Take 20 mg by mouth daily.  Marland Kitchen LORazepam (ATIVAN) 0.5 MG tablet TAKE 2 TABLETS BY MOUTH TWICE A DAY AS NEEDED  . SYNJARDY XR 12.05-998 MG TB24 Take 2 tablets by mouth every morning.  . [DISCONTINUED] LORazepam (ATIVAN) 0.5 MG tablet TAKE 2 TABLETS BY MOUTH TWICE A DAY AS NEEDED  . [DISCONTINUED] Semaglutide (OZEMPIC, 1 MG/DOSE, [place]) Inject 1 mg into the skin every 7 (seven) days.   Social History   Tobacco Use  . Smoking status: Former Smoker     Quit date: 01/10/1998    Years since quitting: 21.4  . Smokeless tobacco: Never Used  Substance Use Topics  . Alcohol use: No    Alcohol/week: 0.0 standard drinks   Family History  Problem Relation Age of Onset  . Diabetes Mother   . Rheum arthritis Mother   . Depression Mother   . High blood pressure Mother   . Osteoarthritis Father   . Heart disease Father 45  . High Cholesterol Father   . High blood pressure Father   . Healthy Brother   . Hypertension Sister   . Hyperlipidemia Sister   . Heart disease Sister        cabg age 23  . Diabetes Maternal Grandmother   . High blood pressure Maternal Grandmother   . High Cholesterol Maternal Grandmother   . Heart attack Maternal Grandfather   . Heart disease Maternal Grandfather   . Heart disease Paternal Grandfather   . Testicular cancer Son   . Colon cancer Neg Hx      Review of Systems  Constitutional: Negative for chills, fatigue and fever.  Respiratory: Negative for cough, chest tightness, shortness of breath and wheezing.   Cardiovascular: Negative for chest pain, palpitations and leg swelling.  Psychiatric/Behavioral: The patient is nervous/anxious.     Objective:  BP 112/70 (BP Location: Left Arm, Patient Position: Sitting, Cuff Size: Large)   Pulse (!) 104   Temp 98 F (36.7 C) (Temporal)   Ht 5\' 7"  (1.702 m)   Wt 190 lb (86.2 kg)   SpO2 95%   BMI 29.76 kg/m   Weight: 190 lb (86.2 kg)   BP Readings from Last 3 Encounters:  06/28/19 112/70  11/18/15 115/75  07/16/15 (!) 125/104   Wt Readings from Last 3 Encounters:  06/28/19 190 lb (86.2 kg)  11/18/15 200 lb 3.2 oz (90.8 kg)  07/16/15 185 lb (83.9 kg)    Physical Exam Constitutional:      General: He is not in acute distress.    Appearance: He is well-developed.  Cardiovascular:     Rate and Rhythm: Normal rate and regular rhythm.     Heart sounds: Normal heart sounds. No murmur heard.  No friction rub.  Pulmonary:     Effort: Pulmonary  effort is normal. No respiratory distress.     Breath sounds: Normal breath sounds. No wheezing or rales.  Musculoskeletal:     Right lower leg: No edema.     Left lower leg: No edema.  Skin:    Comments: Normal monofilament exam bilaterally.  Pleasant patient is missing great toe and second toe on his right foot secondary to a lawnmower accident.  No ulcers, calluses, skin abnormalities noted on the feet.  Neurological:     Mental Status: He is alert and oriented to person, place, and time.  Psychiatric:  Attention and Perception: Attention normal.        Mood and Affect: Mood is anxious and depressed.        Speech: Speech normal.        Behavior: Behavior normal.        Thought Content: Thought content does not include suicidal plan.     Assessment/Plan:  1. Controlled type 2 diabetes mellitus with complication, with long-term current use of insulin ([place]) He has had some extreme to sugar, so I did encourage him to follow-up with his endocrinologist, although overall he has been fairly well controlled and he would prefer to have diabetes managed here in this office.  I would like to see how he does with his control in the next few months, and if staying stable I am happy to manage his care. - Semaglutide, 1 MG/DOSE, (OZEMPIC, 1 MG/DOSE,) 4 MG/3ML SOPN; Inject 0.75 mLs (1 mg total) into the skin every 7 (seven) days.  Dispense: 3 mL; Refill: 5 - Hemoglobin A1c; Future - [facility] DIABETES FOOT EXAM - Microalbumin / creatinine urine ratio; Future  2. Essential hypertension Blood pressure has been well controlled.  Continue current medication. - Comprehensive metabolic panel; Future - CBC with Differential/Platelet; Future  3. Anxiety state For his history, he has had a very difficult time tolerating medications.  We have requested today's previous psychiatry notes as well as they are lab assessment of his med intolerance list.  I will contact him once I see this come through to  determine if there is something we might try to help with anxiety overall.  We discussed importance of daily exercise to help with stress relief.  Encouraged him to talk with family about necessity of his job and encouraged him to consider therapy.  Previous experiences with therapy were not very good, but the [facility] pamphlet was given today and I did mention Dennison Bulla who I feel would be a good fit for him. - LORazepam (ATIVAN) 0.5 MG tablet; TAKE 2 TABLETS BY MOUTH TWICE A DAY AS NEEDED  Dispense: 180 tablet; Refill: 1  4. Attention deficit hyperactivity disorder (ADHD), predominantly inattentive type Previously has not done well with medications.  Would like to see what psychiatry states and their notes about this.  5. Hyperlipidemia associated with type 2 diabetes mellitus ([place]) Currently tolerating atorvastatin 10 mg. - Lipid panel; Future  6. Screening for HIV (human immunodeficiency virus) - HIV Antibody (routine testing w rflx); Future  7. Encounter for hepatitis C screening test for low risk patient - Hepatitis C antibody; Future  Return in about 2 months (around 08/28/2019) for bloodwork; follow up pending results.  Micheline Rough, MD  Patient discussion, chart review, outside lab review, discussion of potential medication options for treatment, and charting as well as exam 45 minutes.

## 2019-06-30 ENCOUNTER — Encounter: Payer: Self-pay | Admitting: Family Medicine

## 2019-07-01 ENCOUNTER — Encounter: Payer: Self-pay | Admitting: Family Medicine

## 2019-07-02 ENCOUNTER — Encounter: Payer: Self-pay | Admitting: Family Medicine

## 2019-07-29 NOTE — Addendum Note (Signed)
Addended by: Marrion Coy on: 07/29/2019 04:51 PM   Modules accepted: Orders

## 2019-08-28 ENCOUNTER — Other Ambulatory Visit (INDEPENDENT_AMBULATORY_CARE_PROVIDER_SITE_OTHER): Payer: BC Managed Care – PPO

## 2019-08-28 ENCOUNTER — Other Ambulatory Visit: Payer: Self-pay

## 2019-08-28 DIAGNOSIS — Z794 Long term (current) use of insulin: Secondary | ICD-10-CM | POA: Diagnosis not present

## 2019-08-28 DIAGNOSIS — Z1159 Encounter for screening for other viral diseases: Secondary | ICD-10-CM | POA: Diagnosis not present

## 2019-08-28 DIAGNOSIS — E118 Type 2 diabetes mellitus with unspecified complications: Secondary | ICD-10-CM | POA: Diagnosis not present

## 2019-08-28 DIAGNOSIS — Z114 Encounter for screening for human immunodeficiency virus [HIV]: Secondary | ICD-10-CM

## 2019-08-28 DIAGNOSIS — E1169 Type 2 diabetes mellitus with other specified complication: Secondary | ICD-10-CM

## 2019-08-28 DIAGNOSIS — I10 Essential (primary) hypertension: Secondary | ICD-10-CM | POA: Diagnosis not present

## 2019-08-28 DIAGNOSIS — E785 Hyperlipidemia, unspecified: Secondary | ICD-10-CM

## 2019-08-29 LAB — COMPREHENSIVE METABOLIC PANEL
AG Ratio: 2.5 (calc) (ref 1.0–2.5)
ALT: 16 U/L (ref 9–46)
AST: 13 U/L (ref 10–35)
Albumin: 4 g/dL (ref 3.6–5.1)
Alkaline phosphatase (APISO): 55 U/L (ref 35–144)
BUN: 15 mg/dL (ref 7–25)
CO2: 25 mmol/L (ref 20–32)
Calcium: 8.3 mg/dL — ABNORMAL LOW (ref 8.6–10.3)
Chloride: 105 mmol/L (ref 98–110)
Creat: 0.74 mg/dL (ref 0.70–1.33)
Globulin: 1.6 g/dL (calc) — ABNORMAL LOW (ref 1.9–3.7)
Glucose, Bld: 143 mg/dL — ABNORMAL HIGH (ref 65–99)
Potassium: 4 mmol/L (ref 3.5–5.3)
Sodium: 140 mmol/L (ref 135–146)
Total Bilirubin: 0.7 mg/dL (ref 0.2–1.2)
Total Protein: 5.6 g/dL — ABNORMAL LOW (ref 6.1–8.1)

## 2019-08-29 LAB — CBC WITH DIFFERENTIAL/PLATELET
Absolute Monocytes: 504 cells/uL (ref 200–950)
Basophils Absolute: 131 cells/uL (ref 0–200)
Basophils Relative: 1.9 %
Eosinophils Absolute: 110 cells/uL (ref 15–500)
Eosinophils Relative: 1.6 %
HCT: 49.1 % (ref 38.5–50.0)
Hemoglobin: 16.7 g/dL (ref 13.2–17.1)
Lymphs Abs: 1628 cells/uL (ref 850–3900)
MCH: 29.8 pg (ref 27.0–33.0)
MCHC: 34 g/dL (ref 32.0–36.0)
MCV: 87.5 fL (ref 80.0–100.0)
MPV: 10.8 fL (ref 7.5–12.5)
Monocytes Relative: 7.3 %
Neutro Abs: 4526 cells/uL (ref 1500–7800)
Neutrophils Relative %: 65.6 %
Platelets: 219 10*3/uL (ref 140–400)
RBC: 5.61 10*6/uL (ref 4.20–5.80)
RDW: 13.1 % (ref 11.0–15.0)
Total Lymphocyte: 23.6 %
WBC: 6.9 10*3/uL (ref 3.8–10.8)

## 2019-08-29 LAB — LIPID PANEL
Cholesterol: 101 mg/dL (ref <200)
HDL: 60 mg/dL (ref >=40)
LDL Cholesterol (Calc): 30 mg/dL (calc)
Non-HDL Cholesterol (Calc): 41 mg/dL (calc) (ref <130)
Total CHOL/HDL Ratio: 1.7 (calc) (ref <5.0)
Triglycerides: 39 mg/dL (ref <150)

## 2019-08-29 LAB — HEMOGLOBIN A1C
Hgb A1c MFr Bld: 7.9 % of total Hgb — ABNORMAL HIGH (ref <5.7)
Mean Plasma Glucose: 180 (calc)
eAG (mmol/L): 10 (calc)

## 2019-08-29 LAB — MICROALBUMIN / CREATININE URINE RATIO
Creatinine, Urine: 110 mg/dL (ref 20–320)
Microalb Creat Ratio: 3 mcg/mg creat (ref <30)
Microalb, Ur: 0.3 mg/dL

## 2019-08-29 LAB — HIV ANTIBODY (ROUTINE TESTING W REFLEX): HIV 1&2 Ab, 4th Generation: NONREACTIVE

## 2019-08-29 LAB — HEPATITIS C ANTIBODY
Hepatitis C Ab: NONREACTIVE
SIGNAL TO CUT-OFF: 0.01 (ref <1.00)

## 2019-09-27 DIAGNOSIS — E119 Type 2 diabetes mellitus without complications: Secondary | ICD-10-CM | POA: Diagnosis not present

## 2019-10-08 ENCOUNTER — Encounter: Payer: Self-pay | Admitting: Family Medicine

## 2019-10-20 ENCOUNTER — Other Ambulatory Visit: Payer: Self-pay | Admitting: Family Medicine

## 2019-10-21 ENCOUNTER — Other Ambulatory Visit: Payer: Self-pay

## 2019-10-21 ENCOUNTER — Ambulatory Visit (INDEPENDENT_AMBULATORY_CARE_PROVIDER_SITE_OTHER): Payer: BC Managed Care – PPO | Admitting: Family Medicine

## 2019-10-21 ENCOUNTER — Encounter: Payer: Self-pay | Admitting: Family Medicine

## 2019-10-21 VITALS — BP 110/80 | HR 81 | Temp 98.1°F | Ht 67.0 in | Wt 198.6 lb

## 2019-10-21 DIAGNOSIS — E1165 Type 2 diabetes mellitus with hyperglycemia: Secondary | ICD-10-CM

## 2019-10-21 DIAGNOSIS — Z794 Long term (current) use of insulin: Secondary | ICD-10-CM

## 2019-10-21 DIAGNOSIS — F419 Anxiety disorder, unspecified: Secondary | ICD-10-CM

## 2019-10-21 DIAGNOSIS — E1169 Type 2 diabetes mellitus with other specified complication: Secondary | ICD-10-CM | POA: Diagnosis not present

## 2019-10-21 DIAGNOSIS — Z23 Encounter for immunization: Secondary | ICD-10-CM | POA: Diagnosis not present

## 2019-10-21 DIAGNOSIS — I10 Essential (primary) hypertension: Secondary | ICD-10-CM

## 2019-10-21 DIAGNOSIS — M2550 Pain in unspecified joint: Secondary | ICD-10-CM

## 2019-10-21 DIAGNOSIS — E785 Hyperlipidemia, unspecified: Secondary | ICD-10-CM

## 2019-10-21 DIAGNOSIS — R5383 Other fatigue: Secondary | ICD-10-CM

## 2019-10-21 MED ORDER — FREESTYLE LIBRE 14 DAY SENSOR MISC: 1.0000 | 5 refills | Status: DC

## 2019-10-21 NOTE — Addendum Note (Signed)
Addended by: Agnes Lawrence on: 10/21/2019 09:37 AM   Modules accepted: Orders

## 2019-10-21 NOTE — Progress Notes (Signed)
Jonathan Gross DOB: 04/13/1965 Encounter date: 10/21/2019  This is a 54 y.o. male who presents with Chief Complaint  Patient presents with  . Follow-up    History of present illness: At last visit was very stressed at work. Getting exhausted by end of day. Just mentally and physically exhausted and little motivation to do anything once he gets home. Started walking dog again now that cooler weather is here - once he gets home.   Hypertension: Lisinopril 20 mg Diabetes type 2: on 8/18 A1C was 7.9 which was up from 02/2019 of 7.0. States not good at checking sugars regularly. Morning sugars 152, 133, 188, 186, 270 - morning sugars tend to be higher. Lunch time 201. 22nd September 174, 189, 150. No issues with low sugars- states that sometimes at 4-5 in afternoon he will feel it coming. Really knocks him out when he gets a low sugar. 110 will feel low for him, but he feels like he needs to get back on track to get used to be lower at baseline. Feels nauseated, low energy when sugars are low. Pretty consistent with morning dosage of synjardy 1 tablet BID, lantus 22 units qhs, ozempic once weekly. Doesn't eat much for breakfast - usually a few crackers if he eats anything. Sometimes fasts in the mornings. Usually packs lunch - sandwich. Usually dinners are larger meals, more planned and tends to eat a bigger dinner. If there is something sweet laying around; he will eat it. Usually has something post dinner that is sweet. Drinks coffee, diet soda during the day. Uses splenda for sweetener or sugar free creamer.    HL: lipitor 10mg  daily.   Anxiety/adhd: still feeling a lot of stress with work.   Up and down 2-3 times/night to go to bathroom. Wakes earlier to maximize weekend days. Usually up at 6am on work days. Not rested when he wakes up.   Allergies  Allergen Reactions  . Amoxicillin Rash   Current Meds  Medication Sig  . aspirin 81 MG tablet Take 81 mg by mouth daily.  Marland Kitchen atorvastatin  (LIPITOR) 10 MG tablet Take 10 mg by mouth daily.   . Continuous Blood Gluc Sensor (FREESTYLE LIBRE 14 DAY SENSOR) MISC by Does not apply route.  . insulin glargine (LANTUS) 100 UNIT/ML injection Inject 18 Units into the skin daily.  . Insulin Pen Needle (PEN NEEDLES) 32G X 4 MM MISC by Does not apply route.  Marland Kitchen lisinopril (PRINIVIL,ZESTRIL) 20 MG tablet Take 20 mg by mouth daily.  Marland Kitchen LORazepam (ATIVAN) 0.5 MG tablet TAKE 2 TABLETS BY MOUTH TWICE A DAY AS NEEDED  . Semaglutide, 1 MG/DOSE, (OZEMPIC, 1 MG/DOSE,) 4 MG/3ML SOPN Inject 0.75 mLs (1 mg total) into the skin every 7 (seven) days.  Marland Kitchen SYNJARDY XR 12.05-998 MG TB24 Take 2 tablets by mouth every morning.    Review of Systems  Constitutional: Negative for chills, fatigue and fever.  Respiratory: Negative for cough, chest tightness, shortness of breath and wheezing.   Cardiovascular: Negative for chest pain, palpitations and leg swelling.  Musculoskeletal: Positive for arthralgias (knees, hands. feels better when walking regularly).    Objective:  BP 110/80 (BP Location: Left Arm, Patient Position: Sitting, Cuff Size: Normal)   Pulse 81   Temp 98.1 F (36.7 C) (Oral)   Ht 5\' 7"  (4.920 m)   Wt 198 lb 9.6 oz (90.1 kg)   BMI 31.11 kg/m   Weight: 198 lb 9.6 oz (90.1 kg)   BP Readings from Last  3 Encounters:  10/21/19 110/80  06/28/19 112/70  11/18/15 115/75   Wt Readings from Last 3 Encounters:  10/21/19 198 lb 9.6 oz (90.1 kg)  06/28/19 190 lb (86.2 kg)  11/18/15 200 lb 3.2 oz (90.8 kg)    Physical Exam Constitutional:      General: He is not in acute distress.    Appearance: He is well-developed.  Cardiovascular:     Rate and Rhythm: Normal rate and regular rhythm.     Heart sounds: Normal heart sounds. No murmur heard.  No friction rub.  Pulmonary:     Effort: Pulmonary effort is normal. No respiratory distress.     Breath sounds: Normal breath sounds. No wheezing or rales.  Musculoskeletal:     Right lower leg: No  edema.     Left lower leg: No edema.  Neurological:     Mental Status: He is alert and oriented to person, place, and time.  Psychiatric:        Behavior: Behavior normal.     Assessment/Plan  1. Essential hypertension Well controlled; continue current medication.   2. Type 2 diabetes mellitus with hyperglycemia, with long-term current use of insulin ([facility]) - [facility] DIABETES FOOT EXAM  3. Anxiety Stable; still having hard time with work. Walking regularly has been helpful.   4. Hyperlipidemia associated with type 2 diabetes mellitus ([place]) Continue with lipitor.     Return in about 1 month (around 11/21/2019) for bloodwork in 1 month; CCV in 4 months.    Micheline Rough, MD

## 2019-10-21 NOTE — Patient Instructions (Addendum)
Increase lantus to 25 units at bedtime.   Work on healthier diabetic meals at lunch and dinner will help out with those higher sugars. Try to get a little protein in the morning - rather than just carbs for breakfast.   [email or web address]

## 2019-11-21 ENCOUNTER — Other Ambulatory Visit (INDEPENDENT_AMBULATORY_CARE_PROVIDER_SITE_OTHER): Payer: BC Managed Care – PPO

## 2019-11-21 ENCOUNTER — Other Ambulatory Visit: Payer: Self-pay

## 2019-11-21 DIAGNOSIS — E1165 Type 2 diabetes mellitus with hyperglycemia: Secondary | ICD-10-CM

## 2019-11-21 DIAGNOSIS — Z794 Long term (current) use of insulin: Secondary | ICD-10-CM

## 2019-11-21 DIAGNOSIS — I10 Essential (primary) hypertension: Secondary | ICD-10-CM | POA: Diagnosis not present

## 2019-11-21 DIAGNOSIS — R5383 Other fatigue: Secondary | ICD-10-CM

## 2019-11-21 DIAGNOSIS — M2550 Pain in unspecified joint: Secondary | ICD-10-CM

## 2019-11-21 LAB — COMPREHENSIVE METABOLIC PANEL WITH GFR
ALT: 14 U/L (ref 0–53)
AST: 13 U/L (ref 0–37)
Albumin: 3.9 g/dL (ref 3.5–5.2)
Alkaline Phosphatase: 54 U/L (ref 39–117)
BUN: 18 mg/dL (ref 6–23)
CO2: 26 meq/L (ref 19–32)
Calcium: 8.4 mg/dL (ref 8.4–10.5)
Chloride: 103 meq/L (ref 96–112)
Creatinine, Ser: 0.82 mg/dL (ref 0.40–1.50)
GFR: 98.96 mL/min
Glucose, Bld: 229 mg/dL — ABNORMAL HIGH (ref 70–99)
Potassium: 4.2 meq/L (ref 3.5–5.1)
Sodium: 138 meq/L (ref 135–145)
Total Bilirubin: 0.7 mg/dL (ref 0.2–1.2)
Total Protein: 5.5 g/dL — ABNORMAL LOW (ref 6.0–8.3)

## 2019-11-21 LAB — TSH: TSH: 1.25 u[IU]/mL (ref 0.35–4.50)

## 2019-11-21 LAB — URIC ACID: Uric Acid, Serum: 4.4 mg/dL (ref 4.0–7.8)

## 2019-11-21 LAB — SEDIMENTATION RATE: Sed Rate: 3 mm/hr (ref 0–20)

## 2019-11-21 LAB — HEMOGLOBIN A1C: Hgb A1c MFr Bld: 8.4 % — ABNORMAL HIGH (ref 4.6–6.5)

## 2019-11-21 NOTE — Addendum Note (Signed)
Addended by: Marrion Coy on: 11/21/2019 07:12 AM   Modules accepted: Orders

## 2020-02-21 ENCOUNTER — Ambulatory Visit (INDEPENDENT_AMBULATORY_CARE_PROVIDER_SITE_OTHER): Payer: BC Managed Care – PPO | Admitting: Family Medicine

## 2020-02-21 ENCOUNTER — Encounter: Payer: Self-pay | Admitting: Family Medicine

## 2020-02-21 ENCOUNTER — Other Ambulatory Visit: Payer: Self-pay

## 2020-02-21 VITALS — BP 128/76 | HR 75 | Temp 98.3°F | Ht 67.0 in | Wt 196.1 lb

## 2020-02-21 DIAGNOSIS — I10 Essential (primary) hypertension: Secondary | ICD-10-CM | POA: Diagnosis not present

## 2020-02-21 DIAGNOSIS — Z794 Long term (current) use of insulin: Secondary | ICD-10-CM | POA: Diagnosis not present

## 2020-02-21 DIAGNOSIS — R252 Cramp and spasm: Secondary | ICD-10-CM

## 2020-02-21 DIAGNOSIS — E1169 Type 2 diabetes mellitus with other specified complication: Secondary | ICD-10-CM

## 2020-02-21 DIAGNOSIS — E118 Type 2 diabetes mellitus with unspecified complications: Secondary | ICD-10-CM | POA: Diagnosis not present

## 2020-02-21 DIAGNOSIS — E785 Hyperlipidemia, unspecified: Secondary | ICD-10-CM

## 2020-02-21 DIAGNOSIS — F419 Anxiety disorder, unspecified: Secondary | ICD-10-CM | POA: Diagnosis not present

## 2020-02-21 LAB — POCT GLYCOSYLATED HEMOGLOBIN (HGB A1C): Hemoglobin A1C: 8 % — AB (ref 4.0–5.6)

## 2020-02-21 MED ORDER — OZEMPIC (1 MG/DOSE) 4 MG/3ML ~~LOC~~ SOPN: 1.0000 mg | PEN_INJECTOR | SUBCUTANEOUS | 3 refills | Status: DC

## 2020-02-21 NOTE — Patient Instructions (Signed)
Work on getting morning sugar closer to fasting goal (below 126) by increasing the lantus one unit at a time to achieve this on a daily basis. Daily exercise is going to help as well with sugars. If you end up at 30 units and are still not at this goal; then let me know.

## 2020-02-21 NOTE — Progress Notes (Signed)
Jonathan Gross DOB: [birth year] Encounter date: 02/21/2020  This is a 55 y.o. male who presents with Chief Complaint  Patient presents with  . Follow-up    History of present illness: Last visit was 10/21/2019.  Things are going well. Has been staying busy with work. 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   Health wise; just tired in evening from being on feet/walking in plant. Sleeping ok. Usually wakes 2-3am with thoughts - running through head; but falls back asleep.   During course of the week has some leg cramping a couple of times. Getting this in whole leg. Sometimes sore the next day from getting cramp night before. Always at night.   DMII: has had a couple of lows - usually can tell it coming and able to ward off symptoms. Still feeling bad if he gets to 110. Has been trying to eat earlier which has helped with morning sugars - usually in the 130-140's. One morning when doing more activity at work it was low in morning. Lantus 22 units qhs, ozempic weekly, synjardy 12.05-998 2 tablets daily. Trying to avoid excess sweets.   Hypertension: Lisinopril 20 mg. Not checking at home.   HL: lipitor 10mg  daily.   Anxiety/adhd: mood has been doing better. Still moments with more significant anxiety, but doing better. Helps to be busy. Still focus issue; worse when anxiety flares.  Allergies  Allergen Reactions  . Amoxicillin Rash   Current Meds  Medication Sig  . aspirin 81 MG tablet Take 81 mg by mouth daily.  Marland Kitchen atorvastatin (LIPITOR) 10 MG tablet Take 10 mg by mouth daily.   . Continuous Blood Gluc Sensor (FREESTYLE LIBRE 14 DAY SENSOR) MISC CHANGE SENSOR EVERY 14 DAYS  . insulin glargine (LANTUS) 100 UNIT/ML injection Inject 24 Units into the skin daily.  . Insulin Pen Needle (PEN NEEDLES) 32G X 4 MM MISC by Does not apply route.  Marland Kitchen lisinopril (PRINIVIL,ZESTRIL) 20 MG tablet Take 20 mg by mouth daily.  Marland Kitchen LORazepam (ATIVAN) 0.5 MG tablet TAKE 2  TABLETS BY MOUTH TWICE A DAY AS NEEDED  . Semaglutide, 1 MG/DOSE, (OZEMPIC, 1 MG/DOSE,) 4 MG/3ML SOPN Inject 0.75 mLs (1 mg total) into the skin every 7 (seven) days.  Marland Kitchen SYNJARDY XR 12.05-998 MG TB24 Take 2 tablets by mouth every morning.    Review of Systems  Constitutional: Negative for chills, fatigue and fever.  Respiratory: Negative for cough, chest tightness, shortness of breath and wheezing.   Cardiovascular: Negative for chest pain, palpitations and leg swelling.  Psychiatric/Behavioral: The patient is nervous/anxious (stable).     Objective:  BP 138/72 (BP Location: Left Arm, Patient Position: Sitting, Cuff Size: Normal)   Pulse 75   Temp 98.3 F (36.8 C) (Oral)   Ht 5\' 7"  (1.702 m)   Wt 196 lb 1.6 oz (89 kg)   BMI 30.71 kg/m   Weight: 196 lb 1.6 oz (89 kg)   BP Readings from Last 3 Encounters:  02/21/20 138/72  10/21/19 110/80  06/28/19 112/70   Wt Readings from Last 3 Encounters:  02/21/20 196 lb 1.6 oz (89 kg)  10/21/19 198 lb 9.6 oz (90.1 kg)  06/28/19 190 lb (86.2 kg)    Physical Exam Constitutional:      General: He is not in acute distress.    Appearance: He is well-developed and well-nourished.  HENT:     Head: Normocephalic and atraumatic.  Cardiovascular:     Rate and Rhythm:  Normal rate and regular rhythm.     Pulses: Intact distal pulses.     Heart sounds: Normal heart sounds. No murmur heard.   Pulmonary:     Effort: Pulmonary effort is normal.     Breath sounds: Normal breath sounds.  Abdominal:     General: Bowel sounds are normal. There is no distension.     Palpations: Abdomen is soft.     Tenderness: There is no abdominal tenderness. There is no guarding.  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 Normal sensation with monofilament.  Skin:    General: Skin is warm and dry.     Comments: Sensory exam of the foot is normal, tested with the monofilament. Good pulses, no lesions or ulcers, good peripheral  pulses.  Psychiatric:        Mood and Affect: Mood and affect normal.        Judgment: Judgment normal.     Assessment/Plan  1. Leg cramps Start with high iron diet; consider Mg 200mg  in evening. Stay hydrated, stretch.  2. Essential hypertension Well controlled; continue lisinopril 20mg  daily.   3. Hyperlipidemia associated with type 2 diabetes mellitus ([place]) Has been very well controlled. Continue lipitor 10mg  daily.  4. Anxiety imporved control. Ativan if needed.   5. Controlled type 2 diabetes mellitus with complication, with long-term current use of insulin ([place]) We are going to work on increasing lantus for fasting with goal of less than 126. Continue other medications. - Semaglutide, 1 MG/DOSE, (OZEMPIC, 1 MG/DOSE,) 4 MG/3ML SOPN; Inject 1 mg into the skin every 7 (seven) days.  Dispense: 9 mL; Refill: 3 - HM DIABETES FOOT EXAM - POC HgB A1c    Return in about 3 months (around 05/20/2020) for Chronic condition visit with A1C check.     Micheline Rough, MD

## 2020-03-25 ENCOUNTER — Encounter: Payer: Self-pay | Admitting: Family Medicine

## 2020-03-26 ENCOUNTER — Other Ambulatory Visit: Payer: Self-pay | Admitting: Family Medicine

## 2020-03-26 MED ORDER — ATORVASTATIN CALCIUM 10 MG PO TABS
10.0000 mg | ORAL_TABLET | Freq: Every day | ORAL | 3 refills | Status: DC
Start: 2020-03-26 — End: 2021-11-09

## 2020-04-23 ENCOUNTER — Other Ambulatory Visit: Payer: Self-pay | Admitting: Family Medicine

## 2020-04-23 DIAGNOSIS — F411 Generalized anxiety disorder: Secondary | ICD-10-CM

## 2020-05-22 ENCOUNTER — Other Ambulatory Visit: Payer: Self-pay

## 2020-05-22 ENCOUNTER — Ambulatory Visit (INDEPENDENT_AMBULATORY_CARE_PROVIDER_SITE_OTHER): Payer: BC Managed Care – PPO | Admitting: Family Medicine

## 2020-05-22 ENCOUNTER — Encounter: Payer: Self-pay | Admitting: Family Medicine

## 2020-05-22 VITALS — BP 120/80 | HR 95 | Temp 98.1°F | Ht 67.0 in | Wt 197.6 lb

## 2020-05-22 DIAGNOSIS — E785 Hyperlipidemia, unspecified: Secondary | ICD-10-CM

## 2020-05-22 DIAGNOSIS — R252 Cramp and spasm: Secondary | ICD-10-CM

## 2020-05-22 DIAGNOSIS — R5383 Other fatigue: Secondary | ICD-10-CM

## 2020-05-22 DIAGNOSIS — Z794 Long term (current) use of insulin: Secondary | ICD-10-CM

## 2020-05-22 DIAGNOSIS — I10 Essential (primary) hypertension: Secondary | ICD-10-CM

## 2020-05-22 DIAGNOSIS — E1165 Type 2 diabetes mellitus with hyperglycemia: Secondary | ICD-10-CM

## 2020-05-22 DIAGNOSIS — F900 Attention-deficit hyperactivity disorder, predominantly inattentive type: Secondary | ICD-10-CM

## 2020-05-22 DIAGNOSIS — E1169 Type 2 diabetes mellitus with other specified complication: Secondary | ICD-10-CM

## 2020-05-22 DIAGNOSIS — F419 Anxiety disorder, unspecified: Secondary | ICD-10-CM | POA: Diagnosis not present

## 2020-05-22 LAB — POCT GLYCOSYLATED HEMOGLOBIN (HGB A1C): Hemoglobin A1C: 8.4 % — AB (ref 4.0–5.6)

## 2020-05-22 LAB — COMPREHENSIVE METABOLIC PANEL
ALT: 16 U/L (ref 0–53)
AST: 14 U/L (ref 0–37)
Albumin: 4.3 g/dL (ref 3.5–5.2)
Alkaline Phosphatase: 64 U/L (ref 39–117)
BUN: 14 mg/dL (ref 6–23)
CO2: 27 mEq/L (ref 19–32)
Calcium: 8.8 mg/dL (ref 8.4–10.5)
Chloride: 103 mEq/L (ref 96–112)
Creatinine, Ser: 0.84 mg/dL (ref 0.40–1.50)
GFR: 97.89 mL/min (ref >60.00)
Glucose, Bld: 151 mg/dL — ABNORMAL HIGH (ref 70–99)
Potassium: 4.1 mEq/L (ref 3.5–5.1)
Sodium: 141 mEq/L (ref 135–145)
Total Bilirubin: 0.9 mg/dL (ref 0.2–1.2)
Total Protein: 6 g/dL (ref 6.0–8.3)

## 2020-05-22 LAB — CBC WITH DIFFERENTIAL/PLATELET
Basophils Absolute: 0.1 10*3/uL (ref 0.0–0.1)
Basophils Relative: 1.8 % (ref 0.0–3.0)
Eosinophils Absolute: 0.1 10*3/uL (ref 0.0–0.7)
Eosinophils Relative: 1.9 % (ref 0.0–5.0)
HCT: 48.5 % (ref 39.0–52.0)
Hemoglobin: 16.8 g/dL (ref 13.0–17.0)
Lymphocytes Relative: 21.8 % (ref 12.0–46.0)
Lymphs Abs: 1.7 10*3/uL (ref 0.7–4.0)
MCHC: 34.6 g/dL (ref 30.0–36.0)
MCV: 87.5 fl (ref 78.0–100.0)
Monocytes Absolute: 0.5 10*3/uL (ref 0.1–1.0)
Monocytes Relative: 7.1 % (ref 3.0–12.0)
Neutro Abs: 5.2 10*3/uL (ref 1.4–7.7)
Neutrophils Relative %: 67.4 % (ref 43.0–77.0)
Platelets: 211 10*3/uL (ref 150.0–400.0)
RBC: 5.54 Mil/uL (ref 4.22–5.81)
RDW: 13.6 % (ref 11.5–15.5)
WBC: 7.7 10*3/uL (ref 4.0–10.5)

## 2020-05-22 LAB — MICROALBUMIN / CREATININE URINE RATIO
Creatinine,U: 60.1 mg/dL
Microalb Creat Ratio: 1.2 mg/g (ref 0.0–30.0)
Microalb, Ur: <0.7 mg/dL (ref 0.0–1.9)

## 2020-05-22 LAB — VITAMIN B12: Vitamin B-12: 274 pg/mL (ref 211–911)

## 2020-05-22 LAB — LIPID PANEL
Cholesterol: 120 mg/dL (ref 0–200)
HDL: 61.5 mg/dL (ref >39.00)
LDL Cholesterol: 49 mg/dL (ref 0–99)
NonHDL: 58.33
Total CHOL/HDL Ratio: 2
Triglycerides: 49 mg/dL (ref 0.0–149.0)
VLDL: 9.8 mg/dL (ref 0.0–40.0)

## 2020-05-22 LAB — FERRITIN: Ferritin: 77.4 ng/mL (ref 22.0–322.0)

## 2020-05-22 LAB — VITAMIN D 25 HYDROXY (VIT D DEFICIENCY, FRACTURES): VITD: 16.57 ng/mL — ABNORMAL LOW (ref 30.00–100.00)

## 2020-05-22 LAB — TSH: TSH: 1.36 u[IU]/mL (ref 0.35–4.50)
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
## 2020-05-22 NOTE — Addendum Note (Signed)
Addended by: Trenda Moots on: 4/43/1540 09:06 AM   Modules accepted: Orders

## 2020-05-25 ENCOUNTER — Encounter: Payer: Self-pay | Admitting: Family Medicine

## 2020-05-25 LAB — MAGNESIUM, RBC: Magnesium RBC: 4.1 mg/dL (ref 4.0–6.4)

## 2020-05-26 MED ORDER — VITAMIN D (ERGOCALCIFEROL) 1.25 MG (50000 UNIT) PO CAPS: 50000.0000 [IU] | ORAL_CAPSULE | ORAL | 0 refills | Status: DC

## 2020-05-26 NOTE — Addendum Note (Signed)
Addended by: Agnes Lawrence on: 05/26/2020 04:18 PM   Modules accepted: Orders

## 2020-06-18 ENCOUNTER — Encounter: Payer: Self-pay | Admitting: Family Medicine

## 2020-06-18 ENCOUNTER — Other Ambulatory Visit: Payer: Self-pay | Admitting: Family Medicine

## 2020-06-18 DIAGNOSIS — F411 Generalized anxiety disorder: Secondary | ICD-10-CM

## 2020-06-22 ENCOUNTER — Other Ambulatory Visit: Payer: Self-pay | Admitting: Family Medicine

## 2020-06-22 NOTE — Telephone Encounter (Signed)
I did refill the lorazepam; but he will need a follow up visit before additional refills. His last rx for this lasted nearly a year; this one has lasted just a couple of months. If need for med is changing; then we should have in office discussion.

## 2020-06-22 NOTE — Telephone Encounter (Signed)
Patient informed of the message below.

## 2020-06-25 ENCOUNTER — Other Ambulatory Visit: Payer: Self-pay | Admitting: Family Medicine

## 2020-06-25 MED ORDER — PEN NEEDLES 32G X 4 MM MISC: 1.0000 | Freq: Every day | 11 refills | Status: DC

## 2020-06-25 MED ORDER — INSULIN GLARGINE 100 UNIT/ML SOLOSTAR PEN: 24.0000 [IU] | PEN_INJECTOR | Freq: Every day | SUBCUTANEOUS | 11 refills | Status: DC

## 2020-06-25 NOTE — Telephone Encounter (Signed)
Please let patient know that pharmacy suggested basaglar - they function about the same but it has better coverage for him. Should be same dosing for him.

## 2020-07-30 DIAGNOSIS — Z23 Encounter for immunization: Secondary | ICD-10-CM | POA: Diagnosis not present

## 2020-08-12 ENCOUNTER — Other Ambulatory Visit: Payer: Self-pay | Admitting: Family Medicine

## 2020-08-21 ENCOUNTER — Other Ambulatory Visit: Payer: Self-pay

## 2020-08-21 ENCOUNTER — Encounter: Payer: Self-pay | Admitting: Family Medicine

## 2020-08-21 ENCOUNTER — Ambulatory Visit (INDEPENDENT_AMBULATORY_CARE_PROVIDER_SITE_OTHER): Payer: BC Managed Care – PPO | Admitting: Family Medicine

## 2020-08-21 VITALS — BP 120/82 | HR 94 | Temp 98.2°F | Ht 67.25 in | Wt 199.8 lb

## 2020-08-21 DIAGNOSIS — Z0000 Encounter for general adult medical examination without abnormal findings: Secondary | ICD-10-CM | POA: Diagnosis not present

## 2020-08-21 DIAGNOSIS — E559 Vitamin D deficiency, unspecified: Secondary | ICD-10-CM

## 2020-08-21 DIAGNOSIS — Z794 Long term (current) use of insulin: Secondary | ICD-10-CM | POA: Diagnosis not present

## 2020-08-21 DIAGNOSIS — E1169 Type 2 diabetes mellitus with other specified complication: Secondary | ICD-10-CM | POA: Diagnosis not present

## 2020-08-21 DIAGNOSIS — E538 Deficiency of other specified B group vitamins: Secondary | ICD-10-CM | POA: Diagnosis not present

## 2020-08-21 DIAGNOSIS — K429 Umbilical hernia without obstruction or gangrene: Secondary | ICD-10-CM

## 2020-08-21 DIAGNOSIS — E785 Hyperlipidemia, unspecified: Secondary | ICD-10-CM

## 2020-08-21 DIAGNOSIS — E1165 Type 2 diabetes mellitus with hyperglycemia: Secondary | ICD-10-CM

## 2020-08-21 LAB — VITAMIN D 25 HYDROXY (VIT D DEFICIENCY, FRACTURES): VITD: 60.73 ng/mL (ref 30.00–100.00)

## 2020-08-21 LAB — VITAMIN B12: Vitamin B-12: 256 pg/mL (ref 211–911)

## 2020-08-21 LAB — FOLATE: Folate: 15.2 ng/mL (ref >5.9)

## 2020-08-21 LAB — HEMOGLOBIN A1C: Hgb A1c MFr Bld: 8.4 % — ABNORMAL HIGH (ref 4.6–6.5)

## 2020-08-21 NOTE — Progress Notes (Signed)
Jonathan Gross DOB: 1965-08-04 Encounter date: 08/21/2020  This is a 55 y.o. male who presents for complete physical   History of present illness/Additional concerns: IDDM: states that sugars are doing ok, but numbers are all over. Hasn't checked for last couple of weeks; fell off his arm. 2 weeks ago average morning was 154; average 6am-12p 195, 12p-6p 159; avg 178 in last 30 days. Right now now traveling and no plans to travel. Feels like he can control better at home and limiting snacks, desserts.   Completed 2nd boost for COVID. No symptoms with this.   Still with some muscle cramps - better since taking vitamin D.   Didn't take the vitamin B12 or magnesium. Lost notes on results.   Due for colonoscopy 03/2020 - will contact GI Past Medical History:  Diagnosis Date   ANXIETY 08/05/2008   DIABETES MELLITUS, TYPE II 09/08/2006   Family history of adverse reaction to anesthesia    father-has intubation issues-small throat, son-nausea and vomiting   GERD 09/08/2006   HYPERTENSION 09/08/2006   PANIC DISORDER, HX OF 10/30/2006   Past Surgical History:  Procedure Laterality Date   APPENDECTOMY  1997   ESOPHAGOGASTRODUODENOSCOPY (EGD) WITH PROPOFOL N/A 07/16/2015   Procedure: ESOPHAGOGASTRODUODENOSCOPY (EGD) WITH PROPOFOL;  Surgeon: [name], MD;  Location: [facility];  Service: Endoscopy;  Laterality: N/A;   EUS N/A 07/16/2015   Procedure: UPPER ENDOSCOPIC ULTRASOUND (EUS) RADIAL;  Surgeon: [name], MD;  Location: [facility];  Service: Endoscopy;  Laterality: N/A;   FINE NEEDLE ASPIRATION N/A 07/16/2015   Procedure: FINE NEEDLE ASPIRATION (FNA) LINEAR;  Surgeon: [name], MD;  Location: [facility];  Service: Endoscopy;  Laterality: N/A;   TOE AMPUTATION Right 2008   great and 2nd toe   VASECTOMY  1999   Allergies  Allergen Reactions   Amoxicillin Rash   No outpatient medications have been marked as taking for the 08/21/20 encounter (Office Visit) with Caren Macadam, MD.   Social History   Tobacco Use   Smoking status: Former    Types: Cigarettes    Quit date: 01/10/1998    Years since quitting: 22.6   Smokeless tobacco: Never  Substance Use Topics   Alcohol use: No    Alcohol/week: 0.0 standard drinks   Family History  Problem Relation Age of Onset   Diabetes Mother    Rheum arthritis Mother    Depression Mother    High blood pressure Mother    Osteoarthritis Father    Heart disease Father 38   High Cholesterol Father    High blood pressure Father    Healthy Brother    Hypertension Sister    Hyperlipidemia Sister    Heart disease Sister        cabg age 70   Diabetes Maternal Grandmother    High blood pressure Maternal Grandmother    High Cholesterol Maternal Grandmother    Heart attack Maternal Grandfather    Heart disease Maternal Grandfather    Heart disease Paternal Grandfather    Testicular cancer Son    Colon cancer Neg Hx      Review of Systems  Constitutional:  Negative for activity change, appetite change, chills, fatigue, fever and unexpected weight change.  HENT:  Negative for congestion, ear pain, hearing loss, sinus pressure, sinus pain, sore throat and trouble swallowing.   Eyes:  Negative for pain and visual disturbance.  Respiratory:  Negative for cough, chest tightness, shortness of breath and wheezing.  Cardiovascular:  Negative for chest pain, palpitations and leg swelling.  Gastrointestinal:  Negative for abdominal distention, abdominal pain, blood in stool, constipation, diarrhea, nausea and vomiting.  Genitourinary:  Negative for decreased urine volume, difficulty urinating, dysuria, penile pain and testicular pain.  Musculoskeletal:  Negative for arthralgias, back pain and joint swelling.  Skin:  Negative for rash.  Neurological:  Negative for dizziness, weakness, numbness and headaches.  Hematological:  Negative for adenopathy. Does not bruise/bleed easily.  Psychiatric/Behavioral:  Negative for  agitation, sleep disturbance and suicidal ideas. The patient is not nervous/anxious.    CBC:  Lab Results  Component Value Date   WBC 7.7 05/22/2020   HGB 16.8 05/22/2020   HCT 48.5 05/22/2020   MCH 29.8 08/28/2019   MCHC 34.6 05/22/2020   RDW 13.6 05/22/2020   PLT 211.0 05/22/2020   MPV 10.8 08/28/2019   CMP: Lab Results  Component Value Date   NA 141 05/22/2020   NA 142 11/18/2015   K 4.1 05/22/2020   CL 103 05/22/2020   CO2 27 05/22/2020   GLUCOSE 151 (H) 05/22/2020   BUN 14 05/22/2020   BUN 14 11/18/2015   CREATININE 0.84 05/22/2020   CREATININE 0.74 08/28/2019   GFRAA 118 11/18/2015   CALCIUM 8.8 05/22/2020   PROT 6.0 05/22/2020   BILITOT 0.9 05/22/2020   ALKPHOS 64 05/22/2020   ALT 16 05/22/2020   AST 14 05/22/2020   LIPID: Lab Results  Component Value Date   CHOL 120 05/22/2020   TRIG 49.0 05/22/2020   HDL 61.50 05/22/2020   LDLCALC 49 05/22/2020   LDLCALC 30 08/28/2019    Objective:  BP 120/82 (BP Location: Left Arm, Patient Position: Sitting, Cuff Size: Large)   Pulse 94   Temp 98.2 F (36.8 C) (Oral)   Ht 5' 7.25" (1.708 m)   Wt 199 lb 12.8 oz (90.6 kg)   SpO2 95%   BMI 31.06 kg/m   Weight: 199 lb 12.8 oz (90.6 kg)   BP Readings from Last 3 Encounters:  08/21/20 120/82  05/22/20 120/80  02/21/20 128/76   Wt Readings from Last 3 Encounters:  08/21/20 199 lb 12.8 oz (90.6 kg)  05/22/20 197 lb 9.6 oz (89.6 kg)  02/21/20 196 lb 1.6 oz (89 kg)    Physical Exam Constitutional:      General: He is not in acute distress.    Appearance: He is well-developed.  HENT:     Head: Normocephalic and atraumatic.     Right Ear: External ear normal.     Left Ear: External ear normal.     Nose: Nose normal.     Mouth/Throat:     Pharynx: No oropharyngeal exudate.  Eyes:     Conjunctiva/sclera: Conjunctivae normal.     Pupils: Pupils are equal, round, and reactive to light.  Neck:     Thyroid: No thyromegaly.  Cardiovascular:     Rate and  Rhythm: Normal rate and regular rhythm.     Heart sounds: Normal heart sounds. No murmur heard.   No friction rub. No gallop.  Pulmonary:     Effort: Pulmonary effort is normal. No respiratory distress.     Breath sounds: Normal breath sounds. No stridor. No wheezing or rales.  Abdominal:     General: Bowel sounds are normal.     Palpations: Abdomen is soft.  Musculoskeletal:        General: Normal range of motion.     Cervical back: Neck supple.  Skin:  General: Skin is warm and dry.  Neurological:     Mental Status: He is alert and oriented to person, place, and time.  Psychiatric:        Behavior: Behavior normal.        Thought Content: Thought content normal.        Judgment: Judgment normal.    Assessment/Plan: Health Maintenance Due  Topic Date Due   COLONOSCOPY (Pts 45-59yr Insurance coverage will need to be confirmed)  04/02/2020   INFLUENZA VACCINE  08/10/2020   Health Maintenance reviewed - prevnar in the fall.    1. Preventative health care Due for colonoscopy; will contact GI. Flu, prevnar in fall.   2. B12 deficiency Hasn't been supplementing.  - Vitamin B12; Future - Folate; Future - Homocysteine; Future - Methylmalonic acid, serum; Future  3. Vitamin D deficiency - VITAMIN D 25 Hydroxy (Vit-D Deficiency, Fractures); Future  4. Hyperlipidemia associated with type 2 diabetes mellitus ([place] Lipitor '10mg'$  daily; consider NMR profile in future given strong fam hx cardiac.  5. Type 2 diabetes mellitus with hyperglycemia, with long-term current use of insulin ([place] Had been better controlled in last month than prior month after freestyle review. Check over next two weeks; I will look into linking his report to our system. - Hemoglobin A1c; Future  6. Umbilical hernia without obstruction and without gangrene He will consider surgery.    Return in about 4 months (around 12/21/2020) for Chronic condition visit.  JMicheline Rough MD

## 2020-08-21 NOTE — Addendum Note (Signed)
Addended by: Amanda Cockayne on: 08/21/2020 09:10 AM   Modules accepted: Orders

## 2020-08-21 NOTE — Patient Instructions (Addendum)
Keep monitoring sugars at home. Our goal is to have you in normal range over 90 percent of the time.   *Vitamin B12 is in low normal range.  I would suggest taking a supplement of 500 to 1000 mcg sublingual version a few days a week to help boost levels.  This may also help with leg cramps.  *Magnesium level low normal. If other suggestions from previous results do not seem to help with leg cramping; he could try some magnesium 2-400 mg a few nights/week for a few weeks to see if this helps! I would not suggest staying on magnesium long term. Side effects of magnesium include loose stools.

## 2020-08-24 NOTE — Progress Notes (Signed)
Per Dr. Havery Moros regarding need for follow up colonoscopy (please update HM):   He had one adenoma removed in 03/2015. Given the guideline change for small polyp surveillance, I think his surveillance was pushed to 03/2022 given he had no high risk lesions and only one small pre-cancerous polyp. He should have been notified of this but can update his record to reflect this. Thanks for your note.

## 2020-08-25 ENCOUNTER — Telehealth: Payer: Self-pay | Admitting: *Deleted

## 2020-08-25 LAB — METHYLMALONIC ACID, SERUM: Methylmalonic Acid, Quant: 242 nmol/L (ref 87–318)

## 2020-08-25 LAB — HOMOCYSTEINE: Homocysteine: 12.4 umol/L — ABNORMAL HIGH (ref <11.4)

## 2020-08-25 NOTE — Telephone Encounter (Signed)
-----   Message from Caren Macadam, MD sent at 08/24/2020  2:45 PM EDT -----    ----- Message ----- From: Yetta Flock, MD Sent: 08/21/2020   8:20 AM EDT To: Caren Macadam, MD  Hi. Thanks for the message. He had one adenoma removed in 03/2015. Given the guideline change for small polyp surveillance, I think his surveillance was pushed to 03/2022 given he had no high risk lesions and only one small pre-cancerous polyp. He should have been notified of this but can update his record to reflect this. Thanks for your note.  Richardson Landry  ----- Message ----- From: Caren Macadam, MD Sent: 08/21/2020   8:12 AM EDT To: Yetta Flock, MD  I believe this patient is due for repeat colonoscopy from what I can see of records. He hasn't been notified just wanted to pass along to you to make sure. I told him to call if he doesn't hear from your office for scheduling in the next week. Thanks! - Andris Flurry

## 2020-08-25 NOTE — Telephone Encounter (Signed)
Colonoscopy due date changed to every 7 years as below.

## 2020-08-27 ENCOUNTER — Encounter: Payer: Self-pay | Admitting: Family Medicine

## 2020-10-23 ENCOUNTER — Encounter: Payer: Self-pay | Admitting: Family Medicine

## 2020-10-23 ENCOUNTER — Other Ambulatory Visit: Payer: Self-pay | Admitting: Family Medicine

## 2020-11-05 DIAGNOSIS — M62830 Muscle spasm of back: Secondary | ICD-10-CM | POA: Diagnosis not present

## 2020-11-05 DIAGNOSIS — M9903 Segmental and somatic dysfunction of lumbar region: Secondary | ICD-10-CM | POA: Diagnosis not present

## 2020-11-05 DIAGNOSIS — M5432 Sciatica, left side: Secondary | ICD-10-CM | POA: Diagnosis not present

## 2020-11-05 DIAGNOSIS — M5431 Sciatica, right side: Secondary | ICD-10-CM | POA: Diagnosis not present

## 2020-11-10 ENCOUNTER — Other Ambulatory Visit: Payer: Self-pay

## 2020-11-10 ENCOUNTER — Telehealth: Payer: Self-pay

## 2020-11-10 DIAGNOSIS — M545 Low back pain, unspecified: Secondary | ICD-10-CM | POA: Diagnosis not present
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
## 2020-11-11 ENCOUNTER — Ambulatory Visit: Payer: BC Managed Care – PPO | Admitting: Adult Health

## 2020-11-19 DIAGNOSIS — M5441 Lumbago with sciatica, right side: Secondary | ICD-10-CM | POA: Diagnosis not present

## 2020-11-30 DIAGNOSIS — M5432 Sciatica, left side: Secondary | ICD-10-CM | POA: Diagnosis not present

## 2020-11-30 DIAGNOSIS — M9903 Segmental and somatic dysfunction of lumbar region: Secondary | ICD-10-CM | POA: Diagnosis not present

## 2020-11-30 DIAGNOSIS — M62830 Muscle spasm of back: Secondary | ICD-10-CM | POA: Diagnosis not present

## 2020-11-30 DIAGNOSIS — M5431 Sciatica, right side: Secondary | ICD-10-CM | POA: Diagnosis not present

## 2020-12-31 DIAGNOSIS — H5203 Hypermetropia, bilateral: Secondary | ICD-10-CM | POA: Diagnosis not present

## 2020-12-31 DIAGNOSIS — E119 Type 2 diabetes mellitus without complications: Secondary | ICD-10-CM | POA: Diagnosis not present

## 2020-12-31 DIAGNOSIS — H35033 Hypertensive retinopathy, bilateral: Secondary | ICD-10-CM | POA: Diagnosis not present

## 2020-12-31 LAB — HM DIABETES EYE EXAM

## 2021-01-06 DIAGNOSIS — E119 Type 2 diabetes mellitus without complications: Secondary | ICD-10-CM | POA: Diagnosis not present

## 2021-01-06 DIAGNOSIS — H35033 Hypertensive retinopathy, bilateral: Secondary | ICD-10-CM | POA: Diagnosis not present

## 2021-01-13 ENCOUNTER — Encounter: Payer: Self-pay | Admitting: Family Medicine

## 2021-01-13 ENCOUNTER — Ambulatory Visit (INDEPENDENT_AMBULATORY_CARE_PROVIDER_SITE_OTHER): Payer: BC Managed Care – PPO | Admitting: Family Medicine

## 2021-01-13 ENCOUNTER — Other Ambulatory Visit: Payer: Self-pay

## 2021-01-13 VITALS — BP 110/82 | HR 95 | Temp 98.0°F | Ht 67.25 in | Wt 195.3 lb

## 2021-01-13 DIAGNOSIS — I10 Essential (primary) hypertension: Secondary | ICD-10-CM

## 2021-01-13 DIAGNOSIS — N529 Male erectile dysfunction, unspecified: Secondary | ICD-10-CM

## 2021-01-13 DIAGNOSIS — E118 Type 2 diabetes mellitus with unspecified complications: Secondary | ICD-10-CM | POA: Diagnosis not present

## 2021-01-13 DIAGNOSIS — Z23 Encounter for immunization: Secondary | ICD-10-CM | POA: Diagnosis not present

## 2021-01-13 DIAGNOSIS — Z794 Long term (current) use of insulin: Secondary | ICD-10-CM

## 2021-01-13 LAB — TESTOSTERONE: Testosterone: 366.82 ng/dL (ref 300.00–890.00)

## 2021-01-13 LAB — HEMOGLOBIN A1C: Hgb A1c MFr Bld: 8.2 % — ABNORMAL HIGH (ref 4.6–6.5)

## 2021-01-13 LAB — PSA: PSA: 1.01 ng/mL (ref 0.10–4.00)

## 2021-01-13 MED ORDER — OZEMPIC (1 MG/DOSE) 4 MG/3ML ~~LOC~~ SOPN: 2.0000 mg | PEN_INJECTOR | SUBCUTANEOUS | 3 refills | Status: DC

## 2021-01-13 MED ORDER — SILDENAFIL CITRATE 50 MG PO TABS: 50.0000 mg | ORAL_TABLET | Freq: Every day | ORAL | 0 refills | Status: DC | PRN

## 2021-01-13 NOTE — Progress Notes (Signed)
Jonathan Gross DOB: 15-Sep-1965 Encounter date: 01/13/2021  This is a 56 y.o. male who presents with Chief Complaint  Patient presents with   Follow-up    History of present illness: Last visit was 08/21/2020 for complete physical.  Hypertension: Lisinopril 20 mg daily  Hyperlipidemia associated with type 2 diabetes: Lipitor 10 mg daily; Ozempic 1 mg weekly, Synjardy 2 tablets daily (splits dose), Basaglar 26 units daily. Attributes weight loss to stress. Traveling more, not eating as much, more walking with traveling for work.cutting out less healthy snacks. Taking evening insulin - morning sugars in160-195 in last week. Denies low sugars, but rarely will get low sugar around lunch time if drinking coffee only and not eating. Now feels low around 100.   Anxiety/panic disorder: Ativan 0.5 mg. Had one big panic attack since last visit. These come on quickly and he doesn't have time to let medication take effect.   Couple of episodes of severe lower back pain. Works with [employer] manager, fast food. Doing ok now. Working on stretching and exercises to keep pain away.    Allergies  Allergen Reactions   Amoxicillin Rash   Current Meds  Medication Sig   aspirin 81 MG tablet Take 81 mg by mouth daily.   atorvastatin (LIPITOR) 10 MG tablet Take 1 tablet (10 mg total) by mouth daily.   Continuous Blood Gluc Sensor (FREESTYLE LIBRE 14 DAY SENSOR) MISC APPLY EVERY 14 DAYS   Cyanocobalamin (B-12 PO) Take 1,000 mg by mouth daily.   FOLIC ACID PO Take 0.5 mg by mouth.   Insulin Glargine (BASAGLAR KWIKPEN) 100 UNIT/ML Inject 24 Units into the skin daily. (Patient taking differently: Inject 26 Units into the skin daily.)   Insulin Pen Needle (PEN NEEDLES) 32G X 4 MM MISC 1 Device by Does not apply route daily.   lisinopril (PRINIVIL,ZESTRIL) 20 MG tablet Take 20 mg by mouth daily.   LORazepam (ATIVAN) 0.5 MG tablet TAKE 2 TABLETS BY MOUTH TWICE A DAY AS NEEDED for anxiety   SYNJARDY XR 12.05-998 MG TB24 TAKE 2  TABLETS BY MOUTH WITH BREAKFAST   [DISCONTINUED] Semaglutide, 1 MG/DOSE, (OZEMPIC, 1 MG/DOSE,) 4 MG/3ML SOPN Inject 1 mg into the skin every 7 (seven) days.    Review of Systems  Constitutional:  Negative for chills, fatigue and fever.  Respiratory:  Negative for cough, chest tightness, shortness of breath and wheezing.   Cardiovascular:  Negative for chest pain, palpitations and leg swelling.  Psychiatric/Behavioral:         Things have been stressful. Lost new puppy over [place]. Work has been stressful.   Objective:  BP 110/82 (BP Location: Left Arm, Patient Position: Sitting, Cuff Size: Large)    Pulse 95    Temp 98 F (36.7 C) (Oral)    Ht 5' 7.25" (1.708 m)    Wt 195 lb 4.8 oz (88.6 kg)    SpO2 97%    BMI 30.36 kg/m   Weight: 195 lb 4.8 oz (88.6 kg)   BP Readings from Last 3 Encounters:  01/13/21 110/82  08/21/20 120/82  05/22/20 120/80   Wt Readings from Last 3 Encounters:  01/13/21 195 lb 4.8 oz (88.6 kg)  08/21/20 199 lb 12.8 oz (90.6 kg)  05/22/20 197 lb 9.6 oz (89.6 kg)    Physical Exam Constitutional:      General: He is not in acute distress.    Appearance: He is well-developed.  HENT:     Head: Normocephalic and atraumatic.  Cardiovascular:     Rate  and Rhythm: Normal rate and regular rhythm.     Heart sounds: Normal heart sounds. No murmur heard. Pulmonary:     Effort: Pulmonary effort is normal.     Breath sounds: Normal breath sounds.  Abdominal:     General: Bowel sounds are normal. There is no distension.     Palpations: Abdomen is soft.     Tenderness: There is no abdominal tenderness. There is no guarding.  Feet:     Comments: Normal bilateral monofilament foot exam.  Mild heel callus.  Skin is intact. Skin:    General: Skin is warm and dry.     Comments: Sensory exam of the foot is normal, tested with the monofilament. Good pulses, no lesions or ulcers, good peripheral pulses.  Psychiatric:        Judgment: Judgment normal.     Assessment/Plan  1. Essential hypertension Well controlled.continue with current medications.   2. Need for pneumococcal vaccination - Pneumococcal conjugate vaccine 20-valent (Prevnar 20)  3. Controlled type 2 diabetes mellitus with complication, with long-term current use of insulin ([facility]) Increase ozempic to 2mg  weekly. Continue other medications. I will try to get into freestyle site to monitor numbers.  - Semaglutide, 1 MG/DOSE, (OZEMPIC, 1 MG/DOSE,) 4 MG/3ML SOPN; Inject 2 mg into the skin every 7 (seven) days.  Dispense: 12 mL; Refill: 3 - Hemoglobin A1c; Future - HM DIABETES FOOT EXAM  4. Erectile dysfunction, unspecified erectile dysfunction type - Testosterone; Future - PSA; Future   Return in about 3 months (around 04/13/2021) for Chronic condition visit.      Micheline Rough, MD

## 2021-01-18 ENCOUNTER — Encounter: Payer: Self-pay | Admitting: Family Medicine

## 2021-01-19 ENCOUNTER — Encounter: Payer: Self-pay | Admitting: Family Medicine

## 2021-01-28 NOTE — Telephone Encounter (Signed)
Spoke with Danton Sewer at [employer] and she stated the patient is correct-1mg  was denied and 2mg  is covered.  Message sent to PCP.

## 2021-01-29 ENCOUNTER — Other Ambulatory Visit: Payer: Self-pay | Admitting: Family Medicine

## 2021-01-29 DIAGNOSIS — E118 Type 2 diabetes mellitus with unspecified complications: Secondary | ICD-10-CM

## 2021-01-29 DIAGNOSIS — Z794 Long term (current) use of insulin: Secondary | ICD-10-CM

## 2021-01-29 MED ORDER — OZEMPIC (1 MG/DOSE) 4 MG/3ML ~~LOC~~ SOPN: 2.0000 mg | PEN_INJECTOR | SUBCUTANEOUS | 11 refills | Status: DC

## 2021-01-30 ENCOUNTER — Other Ambulatory Visit: Payer: Self-pay | Admitting: Family Medicine

## 2021-01-30 DIAGNOSIS — E118 Type 2 diabetes mellitus with unspecified complications: Secondary | ICD-10-CM

## 2021-02-01 ENCOUNTER — Other Ambulatory Visit: Payer: Self-pay | Admitting: Family Medicine

## 2021-02-01 MED ORDER — SEMAGLUTIDE (2 MG/DOSE) 8 MG/3ML ~~LOC~~ SOPN: 2.0000 mg | PEN_INJECTOR | SUBCUTANEOUS | 5 refills | Status: DC

## 2021-03-19 ENCOUNTER — Other Ambulatory Visit: Payer: Self-pay | Admitting: Family Medicine

## 2021-04-14 ENCOUNTER — Encounter: Payer: Self-pay | Admitting: Family Medicine

## 2021-04-14 ENCOUNTER — Ambulatory Visit (INDEPENDENT_AMBULATORY_CARE_PROVIDER_SITE_OTHER): Payer: BC Managed Care – PPO | Admitting: Family Medicine

## 2021-04-14 VITALS — BP 120/70 | HR 95 | Temp 98.4°F | Ht 67.25 in | Wt 197.4 lb

## 2021-04-14 DIAGNOSIS — K429 Umbilical hernia without obstruction or gangrene: Secondary | ICD-10-CM

## 2021-04-14 DIAGNOSIS — E1100 Type 2 diabetes mellitus with hyperosmolarity without nonketotic hyperglycemic-hyperosmolar coma (NKHHC): Secondary | ICD-10-CM | POA: Diagnosis not present

## 2021-04-14 LAB — POCT GLYCOSYLATED HEMOGLOBIN (HGB A1C): Hemoglobin A1C: 7.6 % — AB (ref 4.0–5.6)

## 2021-04-14 NOTE — Patient Instructions (Addendum)
*  consider Dr. Jerilee Hoh for follow up if you do not elect to follow with Dr. Marcell Anger.  ? ?*stick with the ozempic '2mg'$  weekly dose for another 2 weeks, but work with the healthier diet plan I gave you. If you are still having loose stools, nausea after 2 weeks, then let me know and we will decrease ozempic back to '1mg'$  weekly and re-think other diabetic meds. Update me with sugars and how you are feeling in a couple of weeks.  ?

## 2021-04-14 NOTE — Progress Notes (Signed)
?Wynelle Link ?DOB: 26-Oct-1965 ?Encounter date: 04/14/2021 ? ?This is a 56 y.o. male who presents with ?Chief Complaint  ?Patient presents with  ? Follow-up  ? ? ?History of present illness: ?Hypertension: ? ?DMII: ozempic increased to '2mg'$  weekly at last visit. Freestyle meter ordered for better sugar monitoring. His stomach feels upset much more on the '2mg'$  dose. Loose stools on medication. Some days hits very hard an hour after lunch. Then stomach sick, upset. Body feels wiped out from this. Actually feels like the same fatigue as a sugar low. Starting to try and walk more. Does tend to snack between meals, unhealthy snack/dessert at bedtime. Hasn't changed much about what he is eating. Does hard candy rather than candy bar or bag of m and m's. Does have nausea with medication, increased in morning. Eating breakfast to compensate for this. No vomiting. Reports some low sugars, but monitor shows 0% below 70 on record.  ? ?DJ:SHFWYOV '10mg'$  daily. ? ?Anxiety/panic disorder: states that this is doing ok. New years was rough; still recovering from sudden loss of puppy. Feels that work is stressful. Not taking lorazepam regularly; just takes occasionally. Once a week at most. These also wipe him out when he has them. Hard because there isn't known trigger - they seem to come out of the blue when he has panic attacks.  ? ?Does feel like umbilical hernia is getting bigger. Worried about what could happen if it is not fixed. Would like to see someone with regards to this. ? ? ?Allergies  ?Allergen Reactions  ? Amoxicillin Rash  ? ?Current Meds  ?Medication Sig  ? aspirin 81 MG tablet Take 81 mg by mouth daily.  ? atorvastatin (LIPITOR) 10 MG tablet Take 1 tablet (10 mg total) by mouth daily.  ? Continuous Blood Gluc Sensor (FREESTYLE LIBRE 14 DAY SENSOR) MISC APPLY EVERY 14 DAYS  ? Cyanocobalamin (B-12 PO) Take 1,000 mg by mouth daily.  ? FOLIC ACID PO Take 0.5 mg by mouth.  ? Insulin Glargine (BASAGLAR KWIKPEN) 100 UNIT/ML  Inject 24 Units into the skin daily. (Patient taking differently: Inject 26 Units into the skin daily.)  ? Insulin Pen Needle (PEN NEEDLES) 32G X 4 MM MISC 1 Device by Does not apply route daily.  ? lisinopril (PRINIVIL,ZESTRIL) 20 MG tablet Take 20 mg by mouth daily.  ? LORazepam (ATIVAN) 0.5 MG tablet TAKE 2 TABLETS BY MOUTH TWICE A DAY AS NEEDED for anxiety  ? Semaglutide, 2 MG/DOSE, 8 MG/3ML SOPN Inject 2 mg as directed once a week.  ? sildenafil (VIAGRA) 50 MG tablet TAKE ONE TABLET BY MOUTH DAILY AS NEEDED FOR ERECTILE DYSFUNCTION  ? SYNJARDY XR 12.05-998 MG TB24 TAKE 2 TABLETS BY MOUTH WITH BREAKFAST  ? ? ?Review of Systems  ?Constitutional:  Negative for chills, fatigue and fever.  ?Respiratory:  Negative for cough, chest tightness, shortness of breath and wheezing.   ?Cardiovascular:  Negative for chest pain, palpitations and leg swelling.  ? ?Objective: ? ?BP 120/70 (BP Location: Left Arm, Patient Position: Sitting, Cuff Size: Normal)   Pulse 95   Temp 98.4 ?F (36.9 ?C) (Oral)   Ht 5' 7.25" (1.708 m)   Wt 197 lb 6.4 oz (89.5 kg)   SpO2 99%   BMI 30.69 kg/m?   Weight: 197 lb 6.4 oz (89.5 kg)  ? ?BP Readings from Last 3 Encounters:  ?04/14/21 120/70  ?01/13/21 110/82  ?08/21/20 120/82  ? ?Wt Readings from Last 3 Encounters:  ?04/14/21 197 lb 6.4  oz (89.5 kg)  ?01/13/21 195 lb 4.8 oz (88.6 kg)  ?08/21/20 199 lb 12.8 oz (90.6 kg)  ? ? ?Physical Exam ?Constitutional:   ?   General: He is not in acute distress. ?   Appearance: He is well-developed.  ?Cardiovascular:  ?   Rate and Rhythm: Normal rate and regular rhythm.  ?   Heart sounds: Normal heart sounds. No murmur heard. ?  No friction rub.  ?Pulmonary:  ?   Effort: Pulmonary effort is normal. No respiratory distress.  ?   Breath sounds: Normal breath sounds. No wheezing or rales.  ?Musculoskeletal:  ?   Right lower leg: No edema.  ?   Left lower leg: No edema.  ?Neurological:  ?   Mental Status: He is alert and oriented to person, place, and time.   ?Psychiatric:     ?   Behavior: Behavior normal.  ? ? ?Assessment/Plan ? ?1. Uncontrolled type 2 DM with hyperosmolar nonketotic hyperglycemia ([place]) ?I gave him diet handout for 1500 cal diet up to 1800 cal. He will try this for 2 weeks and then let me know if nausea, loose stools are not better. He will update me with sugars, weight, overall symptoms.  ?- POC HgB A1c ? ?2. Umbilical hernia without obstruction and without gangrene ?- Ambulatory referral to General Surgery ? ? ? ?Return for Chronic condition visit. ?39 minutes spent in visit, chart review, discussion of follow up plan.  ? ? ?Micheline Rough, MD ?

## 2021-04-19 ENCOUNTER — Encounter: Payer: Self-pay | Admitting: Family Medicine

## 2021-04-20 NOTE — Telephone Encounter (Signed)
That should up to Dr. Ethlyn Gallery  ?

## 2021-05-10 MED ORDER — TOUJEO MAX SOLOSTAR 300 UNIT/ML ~~LOC~~ SOPN: 30.0000 [IU] | PEN_INJECTOR | Freq: Every evening | SUBCUTANEOUS | 1 refills | Status: DC

## 2021-05-10 NOTE — Addendum Note (Signed)
Addended by: Micheline Rough C on: 05/10/2021 05:00 PM ? ? Modules accepted: Orders ? ?

## 2021-05-12 ENCOUNTER — Ambulatory Visit (INDEPENDENT_AMBULATORY_CARE_PROVIDER_SITE_OTHER): Payer: BC Managed Care – PPO | Admitting: Family Medicine

## 2021-05-12 ENCOUNTER — Encounter: Payer: Self-pay | Admitting: Family Medicine

## 2021-05-12 VITALS — BP 142/90 | HR 78 | Temp 98.8°F | Wt 197.0 lb

## 2021-05-12 DIAGNOSIS — G500 Trigeminal neuralgia: Secondary | ICD-10-CM

## 2021-05-12 MED ORDER — METHYLPREDNISOLONE 4 MG PO TBPK: ORAL_TABLET | ORAL | 0 refills | Status: DC

## 2021-05-12 NOTE — Progress Notes (Signed)
? ?  Subjective:  ? ? Patient ID: Jonathan Gross, male    DOB: 11-Apr-1965, 56 y.o.   MRN: [id number] ? ?HPI ?Here for 2 weeks of painful hypersensitivity of the skin along the right jaw line and the right side of the back of the head. This hurts to rub the skin or to turn his head to the side. No rashes have been seen. There is no internal sore throat or URI symptoms. No fever. He took Ibuprofen with no relief.  ? ? ?Review of Systems  ?Constitutional: Negative.   ?HENT:  Negative for congestion, ear pain, facial swelling, postnasal drip, sinus pressure, sore throat and trouble swallowing.   ?Eyes: Negative.   ?Respiratory: Negative.    ?Neurological: Negative.   ? ?   ?Objective:  ? Physical Exam ?Constitutional:   ?   General: He is not in acute distress. ?   Appearance: Normal appearance.  ?HENT:  ?   Head: Normocephalic and atraumatic.  ?   Comments: He is very tender to light palpation in the right occipital area, no adenopathy ?   Right Ear: Tympanic membrane, ear canal and external ear normal.  ?   Left Ear: Tympanic membrane, ear canal and external ear normal.  ?   Nose: Nose normal.  ?   Mouth/Throat:  ?   Pharynx: Oropharynx is clear.  ?Eyes:  ?   Conjunctiva/sclera: Conjunctivae normal.  ?Neck:  ?   Comments: He is very tender to light touch along the right mandibular line and the right upper anterior neck. Deep palpation is not painful for him. No lymph nodes are felt  ?Pulmonary:  ?   Effort: Pulmonary effort is normal.  ?   Breath sounds: Normal breath sounds.  ?Musculoskeletal:  ?   Cervical back: Normal range of motion.  ?Lymphadenopathy:  ?   Cervical: No cervical adenopathy.  ?Neurological:  ?   Mental Status: He is alert and oriented to person, place, and time.  ? ? ? ? ? ?   ?Assessment & Plan:  ?Trigeminal neuralgia, treat with a Medrol dose pack. If this is not successful, we could try Gabapentin. He will follow up as needed.  ?Alysia Penna, MD ? ? ?

## 2021-06-30 ENCOUNTER — Encounter: Payer: Self-pay | Admitting: Family Medicine

## 2021-07-01 ENCOUNTER — Other Ambulatory Visit: Payer: Self-pay

## 2021-07-01 DIAGNOSIS — E1100 Type 2 diabetes mellitus with hyperosmolarity without nonketotic hyperglycemic-hyperosmolar coma (NKHHC): Secondary | ICD-10-CM

## 2021-07-01 MED ORDER — PEN NEEDLES 32G X 4 MM MISC: 1.0000 | Freq: Every day | 4 refills | Status: DC

## 2021-07-18 ENCOUNTER — Other Ambulatory Visit: Payer: Self-pay | Admitting: Family Medicine

## 2021-07-19 ENCOUNTER — Other Ambulatory Visit: Payer: Self-pay | Admitting: *Deleted

## 2021-07-19 NOTE — Telephone Encounter (Signed)
Last prescription- 05/12/21  Last OV- 05/12/21  No future OV scheduled.  Can this patient receive a refill?

## 2021-07-20 MED ORDER — SILDENAFIL CITRATE 50 MG PO TABS
ORAL_TABLET | ORAL | 0 refills | Status: DC
Start: 2021-07-20 — End: 2021-08-04

## 2021-07-21 ENCOUNTER — Telehealth: Payer: Self-pay | Admitting: Family Medicine

## 2021-07-21 NOTE — Telephone Encounter (Signed)
Pt directly exposed to someone that was diagnosed with covid, that person is very sick and patient is requesting a prescription for something to have on hand/in pharmacy just in case he does contract covid.   [id number] [place], [place] - [address]:  [phone] Fax:  [phone]

## 2021-07-22 ENCOUNTER — Telehealth (INDEPENDENT_AMBULATORY_CARE_PROVIDER_SITE_OTHER): Payer: BC Managed Care – PPO | Admitting: Family Medicine

## 2021-07-22 ENCOUNTER — Encounter: Payer: Self-pay | Admitting: Family Medicine

## 2021-07-22 DIAGNOSIS — Z20822 Contact with and (suspected) exposure to covid-19: Secondary | ICD-10-CM | POA: Diagnosis not present

## 2021-07-22 NOTE — Telephone Encounter (Signed)
Spoke with the patient and informed him a visit is needed for evaluation as our office does not send in medications without a visit.  Virtual visit scheduled today at 11:20am with Dr Maudie Mercury.

## 2021-07-22 NOTE — Progress Notes (Signed)
Virtual Visit via Video Note  I connected with Jonathan Gross  on 07/22/21 at 11:20 AM EDT by a video enabled telemedicine application and verified that I am speaking with the correct person using two identifiers.  Location patient: [place] Location provider:work or home office Persons participating in the virtual visit: patient, provider  I discussed the limitations and requested verbal permission for telemedicine visit. The patient expressed understanding and agreed to proceed.   HPI:  Acute telemedicine visit for Covid Exposure: -Onset: close exposure to someone with covid 2 days ago for 10 minutes - was alerted yesterday was exposed, individual was not symptomatic, big room, no masking -Symptoms include:none, patient is wondering about when to test, what to watch out for, if prophylactic tx is indicated, etc -Denies:CP, SOB, nasal congestion, sore throat, fever or any symptoms at this point -Has tried: nothing -Pertinent past medical history: see below -Pertinent medication allergies: Allergies  Allergen Reactions   Amoxicillin Rash  -COVID-19 vaccine status:  had 2 doses and 2 booster Immunization History  Administered Date(s) Administered   Hepatitis B 04/15/2014, 10/14/2014, 04/23/2015   Influenza Split 10/11/2010   Influenza Whole 10/30/2006, 11/06/2008, 11/05/2009, 10/09/2012   Influenza,inj,Quad PF,6+ Mos 10/21/2019   Influenza-Unspecified 09/16/2013, 10/13/2014, 09/24/2020   PFIZER Comirnaty(Gray Top)Covid-19 Tri-Sucrose Vaccine 07/30/2020   PFIZER(Purple Top)SARS-COV-2 Vaccination 03/23/2019, 04/12/2019, 11/11/2019   PNEUMOCOCCAL CONJUGATE-20 01/13/2021   Pneumococcal Polysaccharide-23 02/18/2011   Td 01/10/2006   Tdap 10/25/2017   Zoster Recombinat (Shingrix) 10/25/2017, 12/26/2017     ROS: See pertinent positives and negatives per HPI.  Past Medical History:  Diagnosis Date   ANXIETY 08/05/2008   DIABETES MELLITUS, TYPE II 09/08/2006   Family history of adverse reaction  to anesthesia    father-has intubation issues-small throat, son-nausea and vomiting   GERD 09/08/2006   HYPERTENSION 09/08/2006   PANIC DISORDER, HX OF 10/30/2006    Past Surgical History:  Procedure Laterality Date   APPENDECTOMY  1997   ESOPHAGOGASTRODUODENOSCOPY (EGD) WITH PROPOFOL N/A 07/16/2015   Procedure: ESOPHAGOGASTRODUODENOSCOPY (EGD) WITH PROPOFOL;  Surgeon: [name], MD;  Location: [facility];  Service: Endoscopy;  Laterality: N/A;   EUS N/A 07/16/2015   Procedure: UPPER ENDOSCOPIC ULTRASOUND (EUS) RADIAL;  Surgeon: [name], MD;  Location: [facility];  Service: Endoscopy;  Laterality: N/A;   FINE NEEDLE ASPIRATION N/A 07/16/2015   Procedure: FINE NEEDLE ASPIRATION (FNA) LINEAR;  Surgeon: [name], MD;  Location: [facility];  Service: Endoscopy;  Laterality: N/A;   TOE AMPUTATION Right 2008   great and 2nd toe   VASECTOMY  1999     Current Outpatient Medications:    aspirin 81 MG tablet, Take 81 mg by mouth daily., Disp: , Rfl:    atorvastatin (LIPITOR) 10 MG tablet, Take 1 tablet (10 mg total) by mouth daily., Disp: 90 tablet, Rfl: 3   Continuous Blood Gluc Sensor (FREESTYLE LIBRE 14 DAY SENSOR) MISC, APPLY EVERY 14 DAYS, Disp: 6 each, Rfl: 5   Cyanocobalamin (B-12 PO), Take 1,000 mg by mouth daily., Disp: , Rfl:    FOLIC ACID PO, Take 0.5 mg by mouth., Disp: , Rfl:    insulin glargine, 2 Unit Dial, (TOUJEO MAX SOLOSTAR) 300 UNIT/ML Solostar Pen, Inject 30 Units into the skin at bedtime., Disp: 27 mL, Rfl: 1   Insulin Pen Needle (PEN NEEDLES) 32G X 4 MM MISC, 1 Device by Does not apply route daily., Disp: 100 each, Rfl: 4   lisinopril (PRINIVIL,ZESTRIL) 20 MG tablet, Take 20 mg by mouth  daily., Disp: , Rfl:    LORazepam (ATIVAN) 0.5 MG tablet, TAKE 2 TABLETS BY MOUTH TWICE A DAY AS NEEDED for anxiety, Disp: 120 tablet, Rfl: 0   methylPREDNISolone (MEDROL DOSEPAK) 4 MG TBPK tablet, TAKE 6 TABLETS ON DAY 1 AS DIRECTED ON PACKAGE AND DECREASE BY 1 TAB EACH  DAY FOR A TOTAL OF 6 DAYS, Disp: 21 each, Rfl: 0   Semaglutide, 2 MG/DOSE, 8 MG/3ML SOPN, Inject 2 mg as directed once a week., Disp: 3 mL, Rfl: 5   sildenafil (VIAGRA) 50 MG tablet, TAKE ONE TABLET BY MOUTH DAILY AS NEEDED FOR ERECTILE DYSFUNCTION, Disp: 10 tablet, Rfl: 0   SYNJARDY XR 12.05-998 MG TB24, TAKE 2 TABLETS BY MOUTH WITH BREAKFAST, Disp: 180 tablet, Rfl: 1  EXAM:  VITALS per patient if applicable:  GENERAL: alert, oriented, appears well and in no acute distress  HEENT: atraumatic, conjunttiva clear, no obvious abnormalities on inspection of external nose and ears  NECK: normal movements of the head and neck  LUNGS: on inspection no signs of respiratory distress, breathing rate appears normal, no obvious gross SOB, gasping or wheezing  CV: no obvious cyanosis  MS: moves all visible extremities without noticeable abnormality  PSYCH/NEURO: pleasant and cooperative, no obvious depression or anxiety, speech and thought processing grossly intact  ASSESSMENT AND PLAN:  Discussed the following assessment and plan:  Close exposure to COVID-19 virus  Discussed signs and symptoms of covid, testing options, options and routes for pursuing treatment if positive and isolation guidance per CDC. Advised if does develop covid can get on antiviral byt contacting [place] pharmacy or by doing follow up vv - discussed how to do this on the weekend through [place] if needed. Advised masking around others (particularly vulnerable individuals) until day 10 after exposure to lessen chances of spreading covid.   Advise to seek prompt virtual visit or in person care if any concerns, new symptoms arise, or if is not improving with treatment as expected per our conversation of expected course. Discussed options for follow up care.  I do telemedicine on Tuesdays and Thursdays for [place] and those are the days I am logged into the system. I will be out of the office next week for family  obligations. Advise to schedule follow up visit with PCP, [place] virtual visits or [facility] if any further questions or concerns to avoid delays in care.   I discussed the assessment and treatment plan with the patient. The patient was provided an opportunity to ask questions and all were answered. The patient agreed with the plan and demonstrated an understanding of the instructions.     Lucretia Kern, DO

## 2021-07-22 NOTE — Patient Instructions (Signed)
-  fingers crossed you stay well!  -can test on day 5 after exposure (and sooner if you like)  -should do 3 tests 24-48 hours apart if testing is negative to confirm negative test. If test is positive you can assume you have covid and can contact the pharmacy or do a virtual visit for treatment.  -please consider wearing a well fitted N95 (or equivalent mask) around others for the next 10 days to help decrease the chance of spreading covid   Advise to seek prompt virtual visit or in person care if any concerns or new symptoms arise. I do telemedicine on Tuesdays and Thursdays for [place] and those are the days I am logged into the system. I will be out of the office next week for family obligations. Advise to schedule follow up visit with PCP, [place] virtual visits or [facility] if any further questions or concerns to avoid delays in care. If you are having severe or life threatening symptoms please call 911 and/or go to the nearest emergency room.

## 2021-08-04 ENCOUNTER — Other Ambulatory Visit: Payer: Self-pay | Admitting: *Deleted

## 2021-08-04 MED ORDER — SILDENAFIL CITRATE 50 MG PO TABS: ORAL_TABLET | ORAL | 0 refills | Status: DC

## 2021-08-12 ENCOUNTER — Other Ambulatory Visit: Payer: Self-pay | Admitting: *Deleted

## 2021-08-13 MED ORDER — SYNJARDY XR 12.5-1000 MG PO TB24: ORAL_TABLET | ORAL | 0 refills | Status: DC

## 2021-08-16 ENCOUNTER — Other Ambulatory Visit: Payer: Self-pay | Admitting: *Deleted

## 2021-08-16 MED ORDER — SEMAGLUTIDE (2 MG/DOSE) 8 MG/3ML ~~LOC~~ SOPN: 2.0000 mg | PEN_INJECTOR | SUBCUTANEOUS | 0 refills | Status: DC

## 2021-08-18 ENCOUNTER — Encounter: Payer: Self-pay | Admitting: Family Medicine

## 2021-08-18 ENCOUNTER — Ambulatory Visit (INDEPENDENT_AMBULATORY_CARE_PROVIDER_SITE_OTHER): Payer: BC Managed Care – PPO | Admitting: Family Medicine

## 2021-08-18 VITALS — BP 130/82 | HR 85 | Temp 98.1°F | Ht 67.25 in | Wt 196.3 lb

## 2021-08-18 DIAGNOSIS — I10 Essential (primary) hypertension: Secondary | ICD-10-CM | POA: Diagnosis not present

## 2021-08-18 DIAGNOSIS — E1100 Type 2 diabetes mellitus with hyperosmolarity without nonketotic hyperglycemic-hyperosmolar coma (NKHHC): Secondary | ICD-10-CM | POA: Diagnosis not present

## 2021-08-18 DIAGNOSIS — E785 Hyperlipidemia, unspecified: Secondary | ICD-10-CM

## 2021-08-18 DIAGNOSIS — E1169 Type 2 diabetes mellitus with other specified complication: Secondary | ICD-10-CM

## 2021-08-18 LAB — POCT GLYCOSYLATED HEMOGLOBIN (HGB A1C): Hemoglobin A1C: 8 % — AB (ref 4.0–5.6)

## 2021-08-18 LAB — COMPREHENSIVE METABOLIC PANEL
ALT: 19 U/L (ref 0–53)
AST: 17 U/L (ref 0–37)
Albumin: 4.1 g/dL (ref 3.5–5.2)
Alkaline Phosphatase: 54 U/L (ref 39–117)
BUN: 11 mg/dL (ref 6–23)
CO2: 27 mEq/L (ref 19–32)
Calcium: 8.7 mg/dL (ref 8.4–10.5)
Chloride: 105 mEq/L (ref 96–112)
Creatinine, Ser: 0.91 mg/dL (ref 0.40–1.50)
GFR: 93.79 mL/min (ref >60.00)
Glucose, Bld: 122 mg/dL — ABNORMAL HIGH (ref 70–99)
Potassium: 4.1 mEq/L (ref 3.5–5.1)
Sodium: 139 mEq/L (ref 135–145)
Total Bilirubin: 0.7 mg/dL (ref 0.2–1.2)
Total Protein: 6 g/dL (ref 6.0–8.3)

## 2021-08-18 LAB — LIPID PANEL
Cholesterol: 105 mg/dL (ref 0–200)
HDL: 57 mg/dL (ref >39.00)
LDL Cholesterol: 41 mg/dL (ref 0–99)
NonHDL: 48.23
Total CHOL/HDL Ratio: 2
Triglycerides: 35 mg/dL (ref 0.0–149.0)
VLDL: 7 mg/dL (ref 0.0–40.0)

## 2021-08-18 LAB — TSH: TSH: 1.16 u[IU]/mL (ref 0.35–5.50)

## 2021-08-18 NOTE — Patient Instructions (Signed)
Focus on reducing sugar and starches.

## 2021-08-18 NOTE — Progress Notes (Signed)
Established Patient Office Visit  Subjective   Patient ID: Jonathan Gross, male    DOB: 27-Dec-1965  Age: 56 y.o. MRN: [id number]  Chief Complaint  Patient presents with   Establish Care    # DIABETES Type II,   Current symptoms/problems include none and have been stable.  Known diabetic complications: none Cardiovascular risk factors: advanced age (older than 37 for men, 61 for women), diabetes mellitus, dyslipidemia, hypertension, male gender, and obesity (BMI >= 30 kg/m2) Current diabetic medications include oral agents (dual therapy): combination: synjardy.  Eye exam current (within one year): yes Foot exam current (within one year): yes Weight trend: stable Current diet: diabetic Current exercise: yes  Current monitoring regimen: CGM Home blood sugar records: pt states he thought they were improving Any episodes of hypoglycemia? no  Is He on ACE inhibitor or angiotensin II receptor blocker?  Yes  lisinopril (Prinivil)  The following portions of the patient's history were reviewed and updated as appropriate: current medications and problem list.     Current Outpatient Medications  Medication Instructions   aspirin 81 mg, Oral, Daily   atorvastatin (LIPITOR) 10 mg, Oral, Daily   Continuous Blood Gluc Sensor (FREESTYLE LIBRE 14 DAY SENSOR) MISC APPLY EVERY 14 DAYS   Cyanocobalamin (B-12 PO) 1,000 mg, Oral, Daily   Empagliflozin-metFORMIN HCl ER (SYNJARDY XR) 12.05-998 MG TB24 TAKE 2 TABLETS BY MOUTH WITH BREAKFAST   FOLIC ACID PO 0.5 mg, Oral   Insulin Pen Needle (PEN NEEDLES) 32G X 4 MM MISC 1 Device, Does not apply, Daily   lisinopril (ZESTRIL) 20 mg, Daily   LORazepam (ATIVAN) 0.5 MG tablet TAKE 2 TABLETS BY MOUTH TWICE A DAY AS NEEDED for anxiety   Semaglutide (2 MG/DOSE) 2 mg, Injection, Weekly   sildenafil (VIAGRA) 50 MG tablet TAKE ONE TABLET BY MOUTH DAILY AS NEEDED FOR ERECTILE DYSFUNCTION   Toujeo Max SoloStar 30 Units, Subcutaneous, Nightly    Patient  Active Problem List   Diagnosis Date Noted   Hyperlipidemia associated with type 2 diabetes mellitus ([place]) 10/21/2019   Attention deficit hyperactivity disorder (ADHD) 01/16/2015   PANIC DISORDER, HX OF 10/30/2006   Uncontrolled type 2 DM with hyperosmolar nonketotic hyperglycemia ([place]) 09/08/2006   Essential hypertension 09/08/2006     Review of Systems  All other systems reviewed and are negative.     Objective:     BP 130/82   Pulse 85   Temp 98.1 F (36.7 C) (Oral)   Ht 5' 7.25" (1.708 m)   Wt 196 lb 4.8 oz (89 kg)   SpO2 98%   BMI 30.52 kg/m    Physical Exam Vitals reviewed.  Constitutional:      Appearance: Normal appearance. He is well-groomed and normal weight.  HENT:     Head: Normocephalic and atraumatic.  Eyes:     Extraocular Movements: Extraocular movements intact.     Pupils: Pupils are equal, round, and reactive to light.  Cardiovascular:     Heart sounds: S1 normal and S2 normal. No murmur heard. Pulmonary:     Effort: Pulmonary effort is normal.     Breath sounds: Normal breath sounds and air entry. No rales.  Abdominal:     Palpations: Abdomen is soft.     Tenderness: There is no abdominal tenderness.  Musculoskeletal:     Right lower leg: No edema.     Left lower leg: No edema.  Neurological:     General: No focal deficit present.  Mental Status: He is alert and oriented to person, place, and time.     Gait: Gait is intact.  Psychiatric:        Mood and Affect: Mood and affect normal.    Lab Results  Component Value Date   HGBA1C 8.0 (A) 08/18/2021         The ASCVD Risk score (Arnett DK, et al., 2019) failed to calculate for the following reasons:   The valid total cholesterol range is 130 to 320 mg/dL    Assessment & Plan:   Problem List Items Addressed This Visit       Cardiovascular and Mediastinum   Essential hypertension    Current hypertension medications:       Sig   lisinopril (PRINIVIL,ZESTRIL) 20 MG tablet  (Taking) Take 20 mg by mouth daily.    Well controlled with the above medication, pt checks his BP at home regularly. Will continue to monitor every visit.      Relevant Orders   CMP (Completed)   TSH (Completed)     Endocrine   Uncontrolled type 2 DM with hyperosmolar nonketotic hyperglycemia ([place]) - Primary    Uncontrolled, patient is on synjardy at max dose and also Ozempic 2 mg weekly and Long acting insulin. He is trying to limit sugar and starches. I offered adding additional medication such as glipizide or actos, possibly even switching his Ozempic to mounjaro weekly to see if this would give him better control. Pt states he wants to try adjusting his diet first. I gave him counseling and handouts on low carb foods. RTC in 3 months for repeat A1C.      Relevant Orders   POC HgB A1c (Completed)   Lipid Panel (Completed)   CMP (Completed)   Hyperlipidemia associated with type 2 diabetes mellitus ([place])   Relevant Orders   Lipid Panel (Completed)    Return in about 3 months (around 11/18/2021) for A1C recheck.    Farrel Conners, MD

## 2021-08-18 NOTE — Assessment & Plan Note (Addendum)
Uncontrolled, patient is on synjardy at max dose and also Ozempic 2 mg weekly and Long acting insulin. He is trying to limit sugar and starches. I offered adding additional medication such as glipizide or actos, possibly even switching his Ozempic to mounjaro weekly to see if this would give him better control. Pt states he wants to try adjusting his diet first. I gave him counseling and handouts on low carb foods. RTC in 3 months for repeat A1C. He is due for his annual labs today, will place orders.

## 2021-08-18 NOTE — Assessment & Plan Note (Signed)
Current hypertension medications:      Sig   lisinopril (PRINIVIL,ZESTRIL) 20 MG tablet (Taking) Take 20 mg by mouth daily.     Well controlled with the above medication, pt checks his BP at home regularly. Will continue to monitor every visit.

## 2021-09-28 ENCOUNTER — Other Ambulatory Visit: Payer: Self-pay | Admitting: Family Medicine

## 2021-11-03 ENCOUNTER — Other Ambulatory Visit: Payer: Self-pay | Admitting: Family Medicine

## 2021-11-09 ENCOUNTER — Other Ambulatory Visit: Payer: Self-pay | Admitting: *Deleted

## 2021-11-11 MED ORDER — ATORVASTATIN CALCIUM 10 MG PO TABS: 10.0000 mg | ORAL_TABLET | Freq: Every day | ORAL | 1 refills | Status: DC

## 2021-11-15 ENCOUNTER — Encounter: Payer: Self-pay | Admitting: Family Medicine

## 2021-11-15 DIAGNOSIS — E1100 Type 2 diabetes mellitus with hyperosmolarity without nonketotic hyperglycemic-hyperosmolar coma (NKHHC): Secondary | ICD-10-CM

## 2021-11-15 MED ORDER — TIRZEPATIDE 5 MG/0.5ML ~~LOC~~ SOAJ: 5.0000 mg | SUBCUTANEOUS | 2 refills | Status: DC

## 2021-11-17 NOTE — Telephone Encounter (Signed)
Fax received from [facility] stating the request was denied and given to PCP for review.

## 2021-11-18 ENCOUNTER — Encounter: Payer: Self-pay | Admitting: Family Medicine

## 2021-11-18 ENCOUNTER — Ambulatory Visit (INDEPENDENT_AMBULATORY_CARE_PROVIDER_SITE_OTHER): Payer: BC Managed Care – PPO | Admitting: Family Medicine

## 2021-11-18 VITALS — BP 110/82 | HR 80 | Temp 98.2°F | Ht 67.25 in | Wt 197.4 lb

## 2021-11-18 DIAGNOSIS — Z794 Long term (current) use of insulin: Secondary | ICD-10-CM | POA: Diagnosis not present

## 2021-11-18 DIAGNOSIS — F411 Generalized anxiety disorder: Secondary | ICD-10-CM | POA: Diagnosis not present

## 2021-11-18 DIAGNOSIS — N529 Male erectile dysfunction, unspecified: Secondary | ICD-10-CM

## 2021-11-18 DIAGNOSIS — E1165 Type 2 diabetes mellitus with hyperglycemia: Secondary | ICD-10-CM

## 2021-11-18 DIAGNOSIS — E118 Type 2 diabetes mellitus with unspecified complications: Secondary | ICD-10-CM | POA: Diagnosis not present

## 2021-11-18 DIAGNOSIS — F410 Panic disorder [episodic paroxysmal anxiety] without agoraphobia: Secondary | ICD-10-CM

## 2021-11-18 LAB — POCT GLYCOSYLATED HEMOGLOBIN (HGB A1C): Hemoglobin A1C: 7.8 % — AB (ref 4.0–5.6)

## 2021-11-18 MED ORDER — ESCITALOPRAM OXALATE 5 MG PO TABS: 5.0000 mg | ORAL_TABLET | Freq: Every day | ORAL | 1 refills | Status: DC

## 2021-11-18 MED ORDER — FREESTYLE LIBRE 14 DAY SENSOR MISC: 5 refills | Status: DC

## 2021-11-18 MED ORDER — LORAZEPAM 0.5 MG PO TABS: ORAL_TABLET | ORAL | 0 refills | Status: DC

## 2021-11-18 MED ORDER — SILDENAFIL CITRATE 50 MG PO TABS: ORAL_TABLET | ORAL | 5 refills | Status: AC

## 2021-11-18 MED ORDER — TIRZEPATIDE 10 MG/0.5ML ~~LOC~~ SOAJ: 10.0000 mg | SUBCUTANEOUS | 5 refills | Status: DC

## 2021-11-18 NOTE — Progress Notes (Signed)
Established Patient Office Visit  Subjective   Patient ID: Jonathan Gross, male    DOB: 10-Nov-1965  Age: 56 y.o. MRN: [id number]  Chief Complaint  Patient presents with   Follow-up   Medication Refill    Patient requests a refill on Lorazepam 0.'5mg'$     Patient is here for follow up of his diabetes. Patient reports that he has been out of his ozempic for 3 weeks because the medication is on back order at the pharmacy. We attempted to switch him to [facility] but his insurance wouldn't approve it because they thought it was a medication duplicate. I have called the insurance company today and started a new PA. We are STOPPING the ozempic so that it can be replaced with the mounjaro.   Panic attacks-- patient states he gets panic attacks about once every 2-3 months. States that he feels the attacks coming on. He tries breathing exercises and walking around. Getting some fresh air will also help. States that he gets very tense in high pressure situations, states that sometimes he will wake up with the tense feeling. His last rx was filled in June 2022, he received 120 tablets and it took him about 16 months to take this many tablets. This averages to be around 8-10 tablets per month. States that he went to therapy and learned some coping strategies.    Current Outpatient Medications  Medication Instructions   aspirin 81 mg, Oral, Daily   atorvastatin (LIPITOR) 10 mg, Oral, Daily   Continuous Blood Gluc Sensor (FREESTYLE LIBRE 14 DAY SENSOR) MISC APPLY EVERY 14 DAYS   Cyanocobalamin (B-12 PO) 1,000 mg, Oral, Daily   escitalopram (LEXAPRO) 5 mg, Oral, Daily   FOLIC ACID PO 0.5 mg, Oral   Insulin Pen Needle (PEN NEEDLES) 32G X 4 MM MISC 1 Device, Does not apply, Daily   lisinopril (ZESTRIL) 20 mg, Daily   LORazepam (ATIVAN) 0.5 MG tablet Take 1 tablet daily as needed for panic attack   sildenafil (VIAGRA) 50 MG tablet TAKE ONE TABLET BY MOUTH DAILY AS NEEDED FOR ERECTILE DYSFUNCTION   SYNJARDY  XR 12.05-998 MG TB24 TAKE 2 TABLETS BY MOUTH WITH BREAKFAST   tirzepatide (MOUNJARO) 10 mg, Subcutaneous, Weekly   Toujeo Max SoloStar 30 Units, Subcutaneous, Nightly    Patient Active Problem List   Diagnosis Date Noted   Type 2 diabetes mellitus with hyperglycemia ([place]) 11/18/2021   Hyperlipidemia associated with type 2 diabetes mellitus ([place]) 10/21/2019   Attention deficit hyperactivity disorder (ADHD) 01/16/2015   Generalized anxiety disorder with panic attacks 08/05/2008   PANIC DISORDER, HX OF 10/30/2006   Uncontrolled type 2 DM with hyperosmolar nonketotic hyperglycemia ([place]) 09/08/2006   Essential hypertension 09/08/2006      Review of Systems  All other systems reviewed and are negative.     Objective:     BP 110/82 (BP Location: Left Arm, Patient Position: Sitting, Cuff Size: Normal)   Pulse 80   Temp 98.2 F (36.8 C) (Oral)   Ht 5' 7.25" (1.708 m)   Wt 197 lb 6.4 oz (89.5 kg)   SpO2 96%   BMI 30.69 kg/m    Physical Exam Vitals reviewed.  Constitutional:      Appearance: Normal appearance. He is well-groomed and normal weight.  Eyes:     Extraocular Movements: Extraocular movements intact.     Conjunctiva/sclera: Conjunctivae normal.  Neck:     Thyroid: No thyromegaly.  Cardiovascular:     Rate and Rhythm: Normal rate and  regular rhythm.     Heart sounds: S1 normal and S2 normal. No murmur heard. Pulmonary:     Effort: Pulmonary effort is normal.     Breath sounds: Normal breath sounds and air entry. No rales.  Abdominal:     General: Abdomen is flat. Bowel sounds are normal.  Musculoskeletal:     Right lower leg: No edema.     Left lower leg: No edema.  Neurological:     General: No focal deficit present.     Mental Status: He is alert and oriented to person, place, and time.     Gait: Gait is intact.  Psychiatric:        Mood and Affect: Mood and affect normal.      Results for orders placed or performed in visit on 11/18/21  POC HgB A1c   Result Value Ref Range   Hemoglobin A1C 7.8 (A) 4.0 - 5.6 %   HbA1c POC (<> result, manual entry)     HbA1c, POC (prediabetic range)     HbA1c, POC (controlled diabetic range)        The ASCVD Risk score (Arnett DK, et al., 2019) failed to calculate for the following reasons:   The valid total cholesterol range is 130 to 320 mg/dL    Assessment & Plan:   Problem List Items Addressed This Visit       Endocrine   Type 2 diabetes mellitus with hyperglycemia (HCC) (Chronic)    A1C is 7.8 today, not at goal. I have started a new PA case with his insurance so that we can switch him to [facility] from the ozempic. I have DISCONTINUED the rx for Ozempic in the computer and placed a new order for the Mounjaro 10 mg weekly -- this is a better equivalent dose for him since he was on 2 mg of ozempic weekly prior. His Pending case number is [id number] and the fax number to send clinicals is [phone]. I spent 20 minutes today on the phone with the insurance company in order to complete this process.       Relevant Medications   tirzepatide (MOUNJARO) 10 MG/0.5ML Pen     Other   Generalized anxiety disorder with panic attacks    Patient was getting about 8-10 tablets per month of lorazepam to be used as needed for panic attacks/ high anxiety state. However I had a 20 minute discussion with him today about the chronic long term use of benzodiazepines and the high risk nature of this medication. I advised that the standard first line treatments are safe and effective, I reviewed his past medications and ultimately I recommended starting a low dose of lexapro 5 mg daily to help manage his anxiety on a day to day basis. Patient is agreeable to start this medication. I did give him a small supply of lorazepam while he is starting the lexapro. I will see him back in 3 months for re-evaluation of his symptoms.       Relevant Medications   escitalopram (LEXAPRO) 5 MG tablet   LORazepam (ATIVAN) 0.5 MG  tablet   Other Visit Diagnoses     Controlled type 2 diabetes mellitus with complication, with long-term current use of insulin (HCC)    -  Primary   Relevant Medications   Continuous Blood Gluc Sensor (FREESTYLE LIBRE 14 DAY SENSOR) MISC   tirzepatide (MOUNJARO) 10 MG/0.5ML Pen   Other Relevant Orders   POC HgB A1c (Completed)   Anxiety state  Relevant Medications   escitalopram (LEXAPRO) 5 MG tablet   LORazepam (ATIVAN) 0.5 MG tablet   Erectile dysfunction, unspecified erectile dysfunction type       Relevant Medications   sildenafil (VIAGRA) 50 MG tablet     40 minutes spent on the patient today including reviewing past notes and medications tried, counseling patient on anxiety medications, and calling the insurance company and starting a new PA for the mounjaro.  Return in about 3 months (around 02/18/2022) for follow up anxiety and DM.    Farrel Conners, MD

## 2021-11-18 NOTE — Assessment & Plan Note (Signed)
Patient was getting about 8-10 tablets per month of lorazepam to be used as needed for panic attacks/ high anxiety state. However I had a 20 minute discussion with him today about the chronic long term use of benzodiazepines and the high risk nature of this medication. I advised that the standard first line treatments are safe and effective, I reviewed his past medications and ultimately I recommended starting a low dose of lexapro 5 mg daily to help manage his anxiety on a day to day basis. Patient is agreeable to start this medication. I did give him a small supply of lorazepam while he is starting the lexapro. I will see him back in 3 months for re-evaluation of his symptoms.

## 2021-11-18 NOTE — Telephone Encounter (Signed)
Office notes from today was faxed to [facility] at [phone] with the case number below and ID# [id number] in the comments section.

## 2021-11-18 NOTE — Assessment & Plan Note (Signed)
A1C is 7.8 today, not at goal. I have started a new PA case with his insurance so that we can switch him to [facility] from the ozempic. I have DISCONTINUED the rx for Ozempic in the computer and placed a new order for the Mounjaro 10 mg weekly -- this is a better equivalent dose for him since he was on 2 mg of ozempic weekly prior. His Pending case number is [id number] and the fax number to send clinicals is [phone]. I spent 20 minutes today on the phone with the insurance company in order to complete this process.

## 2021-11-18 NOTE — Telephone Encounter (Signed)
OK I started a new pending case for the mounjaro 10 mg weekly dose. Case #[id number], fax number to send clinicals: [phone], please include member ID number. I finished the note from this morning that documents everyhting.

## 2021-11-19 NOTE — Telephone Encounter (Signed)
Fax received from [place] stating the request approved for [facility] '10mg'$  from 11/19/2021-11/19/2022 and this was sent to be scanned.  I spoke with Estill Bamberg at [facility], informed her of this and she stated they will contact the patient.

## 2021-12-14 ENCOUNTER — Other Ambulatory Visit: Payer: Self-pay | Admitting: Family Medicine

## 2021-12-14 DIAGNOSIS — I10 Essential (primary) hypertension: Secondary | ICD-10-CM

## 2021-12-15 MED ORDER — LISINOPRIL 20 MG PO TABS: 20.0000 mg | ORAL_TABLET | Freq: Every day | ORAL | 3 refills | Status: DC

## 2022-02-01 ENCOUNTER — Other Ambulatory Visit: Payer: Self-pay | Admitting: Family Medicine

## 2022-02-03 ENCOUNTER — Other Ambulatory Visit: Payer: Self-pay | Admitting: Family Medicine

## 2022-02-03 DIAGNOSIS — E1100 Type 2 diabetes mellitus with hyperosmolarity without nonketotic hyperglycemic-hyperosmolar coma (NKHHC): Secondary | ICD-10-CM

## 2022-02-18 ENCOUNTER — Encounter: Payer: Self-pay | Admitting: Family Medicine

## 2022-02-18 ENCOUNTER — Ambulatory Visit (INDEPENDENT_AMBULATORY_CARE_PROVIDER_SITE_OTHER): Payer: BC Managed Care – PPO | Admitting: Family Medicine

## 2022-02-18 VITALS — BP 100/78 | HR 93 | Temp 98.2°F | Ht 67.25 in | Wt 191.8 lb

## 2022-02-18 DIAGNOSIS — F411 Generalized anxiety disorder: Secondary | ICD-10-CM

## 2022-02-18 DIAGNOSIS — E1100 Type 2 diabetes mellitus with hyperosmolarity without nonketotic hyperglycemic-hyperosmolar coma (NKHHC): Secondary | ICD-10-CM

## 2022-02-18 DIAGNOSIS — Z1211 Encounter for screening for malignant neoplasm of colon: Secondary | ICD-10-CM | POA: Diagnosis not present

## 2022-02-18 DIAGNOSIS — D126 Benign neoplasm of colon, unspecified: Secondary | ICD-10-CM | POA: Diagnosis not present

## 2022-02-18 DIAGNOSIS — Z794 Long term (current) use of insulin: Secondary | ICD-10-CM

## 2022-02-18 DIAGNOSIS — F410 Panic disorder [episodic paroxysmal anxiety] without agoraphobia: Secondary | ICD-10-CM

## 2022-02-18 DIAGNOSIS — E1165 Type 2 diabetes mellitus with hyperglycemia: Secondary | ICD-10-CM | POA: Diagnosis not present

## 2022-02-18 LAB — POCT GLYCOSYLATED HEMOGLOBIN (HGB A1C): Hemoglobin A1C: 8.1 % — AB (ref 4.0–5.6)

## 2022-02-18 MED ORDER — LORAZEPAM 0.5 MG PO TABS: ORAL_TABLET | ORAL | 0 refills | Status: DC

## 2022-02-18 NOTE — Progress Notes (Unsigned)
Established Patient Office Visit  Subjective   Patient ID: Jonathan Gross, male    DOB: 02/24/1965  Age: 57 y.o. MRN: [id number]  Chief Complaint  Patient presents with   Medical Management of Chronic Issues    Patient is here for his 3 month follow up.   DM-- A1C has remained around 8.1, pt reports that the holidays were rought   Current Outpatient Medications  Medication Instructions   aspirin 81 mg, Oral, Daily   atorvastatin (LIPITOR) 10 mg, Oral, Daily   BD PEN NEEDLE NANO 2ND GEN 32G X 4 MM MISC See admin instructions   Continuous Blood Gluc Sensor (FREESTYLE LIBRE 14 DAY SENSOR) MISC APPLY EVERY 14 DAYS   Cyanocobalamin (B-12 PO) 1,000 mg, Oral, Daily   Empagliflozin-metFORMIN HCl ER (SYNJARDY XR) 12.05-998 MG TB24 TAKE 2 TABLETS BY MOUTH WITH BREAKFAST   FOLIC ACID PO 0.5 mg, Oral   lisinopril (ZESTRIL) 20 mg, Oral, Daily   LORazepam (ATIVAN) 0.5 MG tablet Take 1 tablet daily as needed for panic attack   sildenafil (VIAGRA) 50 MG tablet TAKE ONE TABLET BY MOUTH DAILY AS NEEDED FOR ERECTILE DYSFUNCTION   tirzepatide (MOUNJARO) 10 mg, Subcutaneous, Weekly   Toujeo Max SoloStar 30 Units, Subcutaneous, Nightly    Patient Active Problem List   Diagnosis Date Noted   Type 2 diabetes mellitus with hyperglycemia (HCC) 11/18/2021   Hyperlipidemia associated with type 2 diabetes mellitus ([place]) 10/21/2019   Attention deficit hyperactivity disorder (ADHD) 01/16/2015   Generalized anxiety disorder with panic attacks 08/05/2008   PANIC DISORDER, HX OF 10/30/2006   Uncontrolled type 2 DM with hyperosmolar nonketotic hyperglycemia ([place]) 09/08/2006   Essential hypertension 09/08/2006      Review of Systems  All other systems reviewed and are negative.     Objective:     BP 100/78 (BP Location: Left Arm, Patient Position: Sitting, Cuff Size: Normal)   Pulse 93   Temp 98.2 F (36.8 C) (Oral)   Ht 5' 7.25" (1.708 m)   Wt 191 lb 12.8 oz (87 kg)   SpO2 98%   BMI 29.82  kg/m  {Vitals History (Optional):23777}  Physical Exam Vitals reviewed.  Constitutional:      Appearance: Normal appearance. He is well-groomed and normal weight.  Eyes:     Extraocular Movements: Extraocular movements intact.     Conjunctiva/sclera: Conjunctivae normal.  Neck:     Thyroid: No thyromegaly.  Cardiovascular:     Rate and Rhythm: Normal rate and regular rhythm.     Heart sounds: S1 normal and S2 normal. No murmur heard. Pulmonary:     Effort: Pulmonary effort is normal.     Breath sounds: Normal breath sounds and air entry. No rales.  Abdominal:     General: Abdomen is flat. Bowel sounds are normal.  Musculoskeletal:     Right lower leg: No edema.     Left lower leg: No edema.  Neurological:     General: No focal deficit present.     Mental Status: He is alert and oriented to person, place, and time.     Gait: Gait is intact.  Psychiatric:        Mood and Affect: Mood and affect normal.      Results for orders placed or performed in visit on 02/18/22  POC HgB A1c  Result Value Ref Range   Hemoglobin A1C 8.1 (A) 4.0 - 5.6 %   HbA1c POC (<> result, manual entry)     HbA1c,  POC (prediabetic range)     HbA1c, POC (controlled diabetic range)      {Labs (Optional):23779}  The ASCVD Risk score (Arnett DK, et al., 2019) failed to calculate for the following reasons:   The valid total cholesterol range is 130 to 320 mg/dL    Assessment & Plan:   Problem List Items Addressed This Visit       Unprioritized   Type 2 diabetes mellitus with hyperglycemia ([place]) - Primary (Chronic)   Relevant Orders   POC HgB A1c (Completed)   Other Visit Diagnoses     Anxiety state       Relevant Medications   LORazepam (ATIVAN) 0.5 MG tablet   Tubular adenoma of colon       Relevant Orders   Ambulatory referral to Gastroenterology   Colon cancer screening       Relevant Orders   Ambulatory referral to Gastroenterology       No follow-ups on file.    Farrel Conners, MD

## 2022-02-21 ENCOUNTER — Encounter: Payer: Self-pay | Admitting: Gastroenterology

## 2022-02-21 NOTE — Assessment & Plan Note (Addendum)
A1C up to 8.1, I recommended increasing his toujeo at least by 5 units daily to help lower his fasting blood sugar, pt is agreeable to this plan. RTC in 3 months for follow up. Foot exam performed today.

## 2022-02-21 NOTE — Assessment & Plan Note (Signed)
I encouraged the patient to start the lexapro 5 mg daily, I advised that SSRI's are first line treatment of generalized anxiety disorder. After discussing it pt did agree to start the medication. RTC in 3 months for follow up. Will continue the PRN lorazepam for now but this is not ideal treatment for this chronic long term problem.

## 2022-03-14 ENCOUNTER — Encounter: Payer: Self-pay | Admitting: Family Medicine

## 2022-03-15 MED ORDER — TIRZEPATIDE 7.5 MG/0.5ML ~~LOC~~ SOAJ: 7.5000 mg | SUBCUTANEOUS | 1 refills | Status: DC

## 2022-03-15 NOTE — Telephone Encounter (Signed)
Please send rx to the [facility]. Thanks!

## 2022-03-22 DIAGNOSIS — F411 Generalized anxiety disorder: Secondary | ICD-10-CM | POA: Insufficient documentation

## 2022-03-22 DIAGNOSIS — Z794 Long term (current) use of insulin: Secondary | ICD-10-CM | POA: Insufficient documentation

## 2022-03-22 DIAGNOSIS — F419 Anxiety disorder, unspecified: Secondary | ICD-10-CM | POA: Insufficient documentation

## 2022-04-04 ENCOUNTER — Ambulatory Visit (AMBULATORY_SURGERY_CENTER): Payer: BC Managed Care – PPO

## 2022-04-04 ENCOUNTER — Encounter: Payer: Self-pay | Admitting: Gastroenterology

## 2022-04-04 VITALS — Ht 67.25 in | Wt 195.0 lb

## 2022-04-04 DIAGNOSIS — G479 Sleep disorder, unspecified: Secondary | ICD-10-CM | POA: Insufficient documentation

## 2022-04-04 DIAGNOSIS — Z86010 Personal history of colonic polyps: Secondary | ICD-10-CM

## 2022-04-04 DIAGNOSIS — E7800 Pure hypercholesterolemia, unspecified: Secondary | ICD-10-CM | POA: Insufficient documentation

## 2022-04-04 MED ORDER — NA SULFATE-K SULFATE-MG SULF 17.5-3.13-1.6 GM/177ML PO SOLN: 1.0000 | Freq: Once | ORAL | 0 refills | Status: AC

## 2022-04-04 NOTE — Progress Notes (Signed)

## 2022-04-06 ENCOUNTER — Encounter: Payer: Self-pay | Admitting: Family Medicine

## 2022-04-13 ENCOUNTER — Other Ambulatory Visit: Payer: Self-pay

## 2022-04-13 ENCOUNTER — Emergency Department (HOSPITAL_BASED_OUTPATIENT_CLINIC_OR_DEPARTMENT_OTHER): Payer: BC Managed Care – PPO

## 2022-04-13 ENCOUNTER — Encounter (HOSPITAL_BASED_OUTPATIENT_CLINIC_OR_DEPARTMENT_OTHER): Payer: Self-pay

## 2022-04-13 ENCOUNTER — Emergency Department (HOSPITAL_BASED_OUTPATIENT_CLINIC_OR_DEPARTMENT_OTHER): Payer: BC Managed Care – PPO | Admitting: Radiology

## 2022-04-13 ENCOUNTER — Emergency Department (HOSPITAL_BASED_OUTPATIENT_CLINIC_OR_DEPARTMENT_OTHER)
Admission: EM | Admit: 2022-04-13 | Discharge: 2022-04-13 | Disposition: A | Discharge status: Home / Self Care | Payer: BC Managed Care – PPO | Attending: Emergency Medicine | Admitting: Emergency Medicine

## 2022-04-13 DIAGNOSIS — Z794 Long term (current) use of insulin: Secondary | ICD-10-CM | POA: Insufficient documentation

## 2022-04-13 DIAGNOSIS — E860 Dehydration: Secondary | ICD-10-CM | POA: Insufficient documentation

## 2022-04-13 DIAGNOSIS — U071 COVID-19: Secondary | ICD-10-CM | POA: Insufficient documentation

## 2022-04-13 DIAGNOSIS — Z7982 Long term (current) use of aspirin: Secondary | ICD-10-CM | POA: Diagnosis not present

## 2022-04-13 DIAGNOSIS — I10 Essential (primary) hypertension: Secondary | ICD-10-CM | POA: Diagnosis not present

## 2022-04-13 DIAGNOSIS — E1165 Type 2 diabetes mellitus with hyperglycemia: Secondary | ICD-10-CM | POA: Insufficient documentation

## 2022-04-13 DIAGNOSIS — R42 Dizziness and giddiness: Secondary | ICD-10-CM | POA: Insufficient documentation

## 2022-04-13 DIAGNOSIS — R059 Cough, unspecified: Secondary | ICD-10-CM | POA: Diagnosis not present

## 2022-04-13 DIAGNOSIS — Z7984 Long term (current) use of oral hypoglycemic drugs: Secondary | ICD-10-CM | POA: Diagnosis not present

## 2022-04-13 DIAGNOSIS — Z79899 Other long term (current) drug therapy: Secondary | ICD-10-CM | POA: Diagnosis not present

## 2022-04-13 LAB — COMPREHENSIVE METABOLIC PANEL
ALT: 17 U/L (ref 0–44)
AST: 16 U/L (ref 15–41)
Albumin: 4.3 g/dL (ref 3.5–5.0)
Alkaline Phosphatase: 57 U/L (ref 38–126)
Anion gap: 14 (ref 5–15)
BUN: 15 mg/dL (ref 6–20)
CO2: 21 mmol/L — ABNORMAL LOW (ref 22–32)
Calcium: 8.8 mg/dL — ABNORMAL LOW (ref 8.9–10.3)
Chloride: 104 mmol/L (ref 98–111)
Creatinine, Ser: 0.74 mg/dL (ref 0.61–1.24)
GFR, Estimated: >60 mL/min (ref >60)
Glucose, Bld: 230 mg/dL — ABNORMAL HIGH (ref 70–99)
Potassium: 3.7 mmol/L (ref 3.5–5.1)
Sodium: 139 mmol/L (ref 135–145)
Total Bilirubin: 0.7 mg/dL (ref 0.3–1.2)
Total Protein: 6.3 g/dL — ABNORMAL LOW (ref 6.5–8.1)

## 2022-04-13 LAB — CBC
HCT: 48.9 % (ref 39.0–52.0)
Hemoglobin: 16.9 g/dL (ref 13.0–17.0)
MCH: 30 pg (ref 26.0–34.0)
MCHC: 34.6 g/dL (ref 30.0–36.0)
MCV: 86.9 fL (ref 80.0–100.0)
Platelets: 209 10*3/uL (ref 150–400)
RBC: 5.63 MIL/uL (ref 4.22–5.81)
RDW: 13.2 % (ref 11.5–15.5)
WBC: 6 10*3/uL (ref 4.0–10.5)
nRBC: 0 % (ref 0.0–0.2)

## 2022-04-13 LAB — URINALYSIS, ROUTINE W REFLEX MICROSCOPIC
Bacteria, UA: NONE SEEN
Bilirubin Urine: NEGATIVE
Glucose, UA: >1000 mg/dL — AB
Hgb urine dipstick: NEGATIVE
Ketones, ur: 40 mg/dL — AB
Leukocytes,Ua: NEGATIVE
Nitrite: NEGATIVE
Protein, ur: NEGATIVE mg/dL
Specific Gravity, Urine: 1.043 — ABNORMAL HIGH (ref 1.005–1.030)
pH: 5 (ref 5.0–8.0)

## 2022-04-13 LAB — RESP PANEL BY RT-PCR (RSV, FLU A&B, COVID)  RVPGX2
Influenza A by PCR: NEGATIVE
Influenza B by PCR: NEGATIVE
Resp Syncytial Virus by PCR: NEGATIVE
SARS Coronavirus 2 by RT PCR: POSITIVE — AB

## 2022-04-13 LAB — CBG MONITORING, ED: Glucose-Capillary: 197 mg/dL — ABNORMAL HIGH (ref 70–99)

## 2022-04-13 MED ORDER — SODIUM CHLORIDE 0.9 % IV BOLUS
1000.0000 mL | Freq: Once | INTRAVENOUS | Status: AC
  Administered 2022-04-13: 1000 mL via INTRAVENOUS

## 2022-04-13 MED ORDER — MECLIZINE HCL 25 MG PO TABS: 25.0000 mg | ORAL_TABLET | Freq: Three times a day (TID) | ORAL | 0 refills | Status: AC | PRN

## 2022-04-13 MED ORDER — ONDANSETRON HCL 4 MG/2ML IJ SOLN
4.0000 mg | Freq: Once | INTRAMUSCULAR | Status: AC
  Administered 2022-04-13: 4 mg via INTRAVENOUS
  Filled 2022-04-13: qty 2

## 2022-04-13 MED ORDER — DIPHENHYDRAMINE HCL 50 MG/ML IJ SOLN
25.0000 mg | Freq: Once | INTRAMUSCULAR | Status: AC
  Administered 2022-04-13: 25 mg via INTRAVENOUS
  Filled 2022-04-13: qty 1

## 2022-04-13 MED ORDER — MECLIZINE HCL 25 MG PO TABS
25.0000 mg | ORAL_TABLET | Freq: Once | ORAL | Status: AC
  Administered 2022-04-13: 25 mg via ORAL
  Filled 2022-04-13: qty 1

## 2022-04-13 MED ORDER — ONDANSETRON HCL 4 MG PO TABS: 4.0000 mg | ORAL_TABLET | Freq: Three times a day (TID) | ORAL | 0 refills | Status: AC | PRN

## 2022-04-13 NOTE — ED Notes (Signed)
Pt verbalized understanding of d/c instructions, meds, and followup care. Denies questions. VSS, no distress noted. Steady gait to exit with all belongings.  ?

## 2022-04-13 NOTE — Discharge Instructions (Signed)
Thank you for letting us take care of you today.  You did test positive for COVID-19.  We do see signs of dehydration and your workup today but otherwise your workup including chest x-ray, CT brain, EKG, and the remainder of your blood work looks okay apart from a high blood glucose level.  I am sending you home with medications similar to those we gave you in the ED to help with your vertigo.  It is very important to stay well-hydrated especially while dealing with your current viral illness.  Please use the medications as prescribed.  Please see instructions below for caring for yourself while dealing with a viral illness.  Viral Illness TREATMENT  Treatment is directed at relieving symptoms. There is no cure. Antibiotics are not effective because the infection is caused by a virus, not by bacteria. Treatment may include:  Increased fluid intake. Sports drinks offer valuable electrolytes, sugars, and fluids.  Breathing heated mist or steam (vaporizer or shower).  Eating chicken soup or other clear broths, and maintaining good nutrition.  Getting plenty of rest.  Using gargles or lozenges for comfort.  Increasing usage of your inhaler if you have asthma.  Return to work when your temperature has returned to normal.  Gargle warm salt water and spit it out for sore throat. Take benadryl to decrease sinus secretions. Continue to alternate between Tylenol and ibuprofen for pain and fever control.  Follow Up: Follow up with your primary care doctor in 5-7 days for recheck of ongoing symptoms.  Return to emergency department for emergent changing or worsening of symptoms.

## 2022-04-13 NOTE — ED Provider Notes (Signed)
[facility] Provider Note   CSN: [id number] Arrival date & time: 04/13/22  1646     History  Chief Complaint  Patient presents with   Dizziness    Jonathan Gross is a 57 y.o. male with past medical history hypertension, type 2 diabetes, anxiety who presents to the ED complaining of dizziness that started this morning.  He reports 3 episodes of spinning like sensation that have happened since this morning where he feels like he is unsteady when he walks and has associated nausea.  With his most recent episode he vomited multiple times.  He denies a history of the symptoms.  He does report that recently he has been ill with cough and congestion for about the last week.  Wife with similar symptoms.  No known other sick contacts.  Patient denies chest pain, shortness of breath, severe headache, vision changes, focal weakness, paresthesias, abdominal pain, or diarrhea.  He denies a history of neurological or cardiac problems.      Home Medications Prior to Admission medications   Medication Sig Start Date End Date Taking? Authorizing Provider  meclizine (ANTIVERT) 25 MG tablet Take 1 tablet (25 mg total) by mouth 3 (three) times daily as needed for up to 5 days for dizziness. 04/13/22 04/18/22 Yes Naiyana Barbian L, PA-C  ondansetron (ZOFRAN) 4 MG tablet Take 1 tablet (4 mg total) by mouth every 8 (eight) hours as needed for up to 5 days for nausea or vomiting. 04/13/22 04/18/22 Yes Mykiah Schmuck L, PA-C  aspirin 81 MG tablet Take 81 mg by mouth daily.    [provider]  atorvastatin (LIPITOR) 10 MG tablet Take 1 tablet (10 mg total) by mouth daily. 11/11/21   Farrel Conners, MD  BD PEN NEEDLE NANO 2ND GEN 32G X 4 MM MISC USE AS DIRECTED 02/03/22   Farrel Conners, MD  Continuous Blood Gluc Sensor (FREESTYLE LIBRE 14 DAY SENSOR) MISC APPLY EVERY 14 DAYS 11/18/21   Farrel Conners, MD  Cyanocobalamin (B-12 PO) Take 1,000 mg by mouth daily.     [provider]  Empagliflozin-metFORMIN HCl ER (SYNJARDY XR) 12.05-998 MG TB24 TAKE 2 TABLETS BY MOUTH WITH BREAKFAST 02/01/22   Farrel Conners, MD  escitalopram (LEXAPRO) 5 MG tablet Take 5 mg by mouth daily.    [provider]  FOLIC ACID PO Take 0.5 mg by mouth.    [provider]  insulin glargine, 2 Unit Dial, (TOUJEO MAX SOLOSTAR) 300 UNIT/ML Solostar Pen Inject 30 Units into the skin at bedtime. 05/10/21   Koberlein, Steele Berg, MD  lisinopril (ZESTRIL) 20 MG tablet Take 1 tablet (20 mg total) by mouth daily. 12/15/21   Farrel Conners, MD  LORazepam (ATIVAN) 0.5 MG tablet Take 1 tablet daily as needed for panic attack 02/18/22   Farrel Conners, MD  [facility], 1 MG/DOSE, 4 MG/3ML SOPN Inject into the skin. Patient not taking: Reported on 04/04/2022 11/03/21   [provider]  sildenafil (VIAGRA) 50 MG tablet TAKE ONE TABLET BY MOUTH DAILY AS NEEDED FOR ERECTILE DYSFUNCTION 11/18/21   Farrel Conners, MD  tirzepatide [facility]) 10 MG/0.5ML Pen Inject 10 mg into the skin once a week. Patient not taking: Reported on 04/04/2022 11/18/21   Farrel Conners, MD  tirzepatide [facility]) 7.5 MG/0.5ML Pen Inject 7.5 mg into the skin once a week. 03/15/22   Farrel Conners, MD      Allergies    Amoxicillin  Review of Systems   Review of Systems  All other systems reviewed and are negative.   Physical Exam Updated Vital Signs BP (!) 149/86 (BP Location: Left Arm)   Pulse 97   Temp 98.1 F (36.7 C) (Oral)   Resp 18   Ht 5\' 7"  (1.702 m)   Wt 87.1 kg   SpO2 98%   BMI 30.07 kg/m  Physical Exam Vitals and nursing note reviewed.  Constitutional:      General: He is not in acute distress.    Appearance: Normal appearance. He is not ill-appearing, toxic-appearing or diaphoretic.  HENT:     Head: Normocephalic and atraumatic.     Right Ear: Tympanic membrane, ear canal and external ear normal. There is no impacted cerumen.     Left Ear: Tympanic  membrane, ear canal and external ear normal. There is no impacted cerumen.     Nose: Congestion present.     Mouth/Throat:     Mouth: Mucous membranes are moist.     Pharynx: Oropharynx is clear. No oropharyngeal exudate or posterior oropharyngeal erythema.  Eyes:     General: No scleral icterus.    Extraocular Movements: Extraocular movements intact.     Conjunctiva/sclera: Conjunctivae normal.     Pupils: Pupils are equal, round, and reactive to light.  Cardiovascular:     Rate and Rhythm: Normal rate and regular rhythm.     Heart sounds: No murmur heard. Pulmonary:     Effort: Pulmonary effort is normal. No respiratory distress.     Breath sounds: Normal breath sounds. No stridor. No wheezing, rhonchi or rales.  Abdominal:     General: Abdomen is flat. There is no distension.     Palpations: Abdomen is soft.     Tenderness: There is no abdominal tenderness. There is no right CVA tenderness, left CVA tenderness, guarding or rebound.  Musculoskeletal:        General: No deformity. Normal range of motion.     Cervical back: Normal range of motion and neck supple. No rigidity.     Right lower leg: No edema.     Left lower leg: No edema.  Skin:    General: Skin is warm and dry.     Capillary Refill: Capillary refill takes less than 2 seconds.     Coloration: Skin is not jaundiced or pale.     Findings: No rash.  Neurological:     General: No focal deficit present.     Mental Status: He is alert and oriented to person, place, and time.     Cranial Nerves: No cranial nerve deficit.     Sensory: No sensory deficit.     Motor: No weakness.     Coordination: Coordination normal.  Psychiatric:        Mood and Affect: Mood normal.        Behavior: Behavior normal.     ED Results / Procedures / Treatments   Labs (all labs ordered are listed, but only abnormal results are displayed) Labs Reviewed  RESP PANEL BY RT-PCR (RSV, FLU A&B, COVID)  RVPGX2 - Abnormal; Notable for the  following components:      Result Value   SARS Coronavirus 2 by RT PCR POSITIVE (*)    All other components within normal limits  COMPREHENSIVE METABOLIC PANEL - Abnormal; Notable for the following components:   CO2 21 (*)    Glucose, Bld 230 (*)    Calcium 8.8 (*)    Total Protein  6.3 (*)    All other components within normal limits  URINALYSIS, ROUTINE W REFLEX MICROSCOPIC - Abnormal; Notable for the following components:   Color, Urine COLORLESS (*)    Specific Gravity, Urine 1.043 (*)    Glucose, UA >1,000 (*)    Ketones, ur 40 (*)    All other components within normal limits  CBG MONITORING, ED - Abnormal; Notable for the following components:   Glucose-Capillary 197 (*)    All other components within normal limits  CBC    EKG Normal sinus rhythm at 99 bpm, no acute ST-T changes, similar to previous EKG, no STEMI  Radiology DG Chest 2 View  Result Date: 04/13/2022 CLINICAL DATA:  Cough congestion EXAM: CHEST - 2 VIEW COMPARISON:  04/28/2006 FINDINGS: The heart size and mediastinal contours are within normal limits. Both lungs are clear. Multilevel degenerative osteophyte. IMPRESSION: No active cardiopulmonary disease. Electronically Signed   By: Donavan Foil M.D.   On: 04/13/2022 18:45   CT Head Wo Contrast  Result Date: 04/13/2022 CLINICAL DATA:  Dizziness with nausea EXAM: CT HEAD WITHOUT CONTRAST TECHNIQUE: Contiguous axial images were obtained from the base of the skull through the vertex without intravenous contrast. RADIATION DOSE REDUCTION: This exam was performed according to the departmental dose-optimization program which includes automated exposure control, adjustment of the mA and/or kV according to patient size and/or use of iterative reconstruction technique. COMPARISON:  MRI 12/06/2015 FINDINGS: Brain: No evidence of acute infarction, hemorrhage, hydrocephalus, extra-axial collection or mass lesion/mass effect. Vascular: No hyperdense vessel or unexpected  calcification. Skull: Normal. Negative for fracture or focal lesion. Sinuses/Orbits: No acute finding. Other: None IMPRESSION: Negative head CT without contrast. Electronically Signed   By: Donavan Foil M.D.   On: 04/13/2022 18:44    Procedures Procedures    Medications Ordered in ED Medications  ondansetron (ZOFRAN) injection 4 mg (4 mg Intravenous Given 04/13/22 1706)  sodium chloride 0.9 % bolus 1,000 mL (0 mLs Intravenous Stopped 04/13/22 1924)  meclizine (ANTIVERT) tablet 25 mg (25 mg Oral Given 04/13/22 1804)  diphenhydrAMINE (BENADRYL) injection 25 mg (25 mg Intravenous Given 04/13/22 1804)    ED Course/ Medical Decision Making/ A&P                             Medical Decision Making Amount and/or Complexity of Data Reviewed Labs: ordered. Decision-making details documented in ED Course. Radiology: ordered. Decision-making details documented in ED Course. ECG/medicine tests: ordered. Decision-making details documented in ED Course.  Risk Prescription drug management.   Medical Decision Making:   Balraj Ifft is a 57 y.o. male who presented to the ED today with dizziness detailed above.    Additional history discussed with patient's family/caregivers.  Patient's presentation is complicated by their history of hypertension, diabetes, anxiety, recent illness.  Patient placed on continuous vitals and telemetry monitoring while in ED which was reviewed periodically.  Complete initial physical exam performed, notably the patient  was in no acute distress.  Regular rate and rhythm.  Lungs clear to auscultation.  Nonfocal neurologic exam.  Normal ENT exam as above.  Slightly hypertensive but otherwise vital signs unremarkable.    Reviewed and confirmed nursing documentation for past medical history, family history, social history.    Initial Assessment:   With the patient's presentation of dizziness, most likely diagnosis is vertigo. Differential diagnosis includes but is not limited  to BPPV, vestibular migraine, head trauma, AVM, intracranial tumor, multiple  sclerosis, drug-related, CVA, vasovagal syncope, orthostatic hypotension, sepsis, hypoglycemia, electrolyte disturbance, anemia, anxiety/panic attack, viral illness.  This is most consistent with an acute complicated illness  Initial Plan:  Screening labs including CBC and Metabolic panel to evaluate for infectious or metabolic etiology of disease.  Urinalysis with reflex culture ordered to evaluate for UTI or relevant urologic/nephrologic pathology.  Viral swabs CXR to evaluate for structural/infectious intrathoracic pathology.  EKG to evaluate for cardiac pathology Objective evaluation as reviewed   Initial Study Results:   Laboratory  All laboratory results reviewed without evidence of clinically relevant pathology.   Exceptions include: Glucose 230, calcium 8.8, COVID-positive, UA positive for ketones  EKG EKG was reviewed independently. ST segments without concerns for elevations.   EKG: unchanged from previous tracings, normal sinus rhythm.   Radiology:  All images reviewed independently. Agree with radiology report at this time.   DG Chest 2 View  Result Date: 04/13/2022 CLINICAL DATA:  Cough congestion EXAM: CHEST - 2 VIEW COMPARISON:  04/28/2006 FINDINGS: The heart size and mediastinal contours are within normal limits. Both lungs are clear. Multilevel degenerative osteophyte. IMPRESSION: No active cardiopulmonary disease. Electronically Signed   By: Donavan Foil M.D.   On: 04/13/2022 18:45   CT Head Wo Contrast  Result Date: 04/13/2022 CLINICAL DATA:  Dizziness with nausea EXAM: CT HEAD WITHOUT CONTRAST TECHNIQUE: Contiguous axial images were obtained from the base of the skull through the vertex without intravenous contrast. RADIATION DOSE REDUCTION: This exam was performed according to the departmental dose-optimization program which includes automated exposure control, adjustment of the mA and/or kV  according to patient size and/or use of iterative reconstruction technique. COMPARISON:  MRI 12/06/2015 FINDINGS: Brain: No evidence of acute infarction, hemorrhage, hydrocephalus, extra-axial collection or mass lesion/mass effect. Vascular: No hyperdense vessel or unexpected calcification. Skull: Normal. Negative for fracture or focal lesion. Sinuses/Orbits: No acute finding. Other: None IMPRESSION: Negative head CT without contrast. Electronically Signed   By: Donavan Foil M.D.   On: 04/13/2022 18:44      Final Assessment and Plan:   This is a 57 year old male who presents to the ED for evaluation of dizziness that started this morning.  Dizziness has been episodic, described as a spinning sensation, and now associated with vomiting.  Recent URI type symptoms.  Workup obtained as above for further evaluation.  Patient has nonfocal neurologic exam.  Normal heart, lung, neurologic, and abdominal exam.  No abnormalities noted on ENT exam as above.  Slightly hypertensive but otherwise vital signs within normal limits.  Medications ordered for symptomatic relief in addition to fluids for treatment with good relief.  Patient notes that following these he has been able to ambulate while in the ED.  Workup positive for COVID-19.  UA does appear consistent with dehydration but no acute UTI.  Chest x-ray without pneumonia.  CT brain negative.  Patient notes since becoming ill he has had poorly controlled glucose control at home that he did recently restart Ozempic as well and is otherwise adherent with his medications.  No anion gap today.  Suspect DKA.  That patient's symptoms are related to acute viral illness.  No history of underlying asthma, COPD, or other indication for Paxlovid in addition to difficulty establishing timeframe of patient's symptoms.  Discussed all findings with patient and wife at bedside as well as plan to discharge home with meclizine, Zofran, PCP follow-up.  Patient aware that he should keep  this follow-up and may need additional follow-up with ENT or other  specialist in the future if he continues to have vertigo-like symptoms.  Strict ED return precautions given, all questions answered, and stable for discharge.   Clinical Impression:  1. COVID-19   2. Dehydration   3. Vertigo   4. Hyperglycemia due to diabetes mellitus      Discharge           Final Clinical Impression(s) / ED Diagnoses Final diagnoses:  COVID-19  Dehydration  Vertigo  Hyperglycemia due to diabetes mellitus    Rx / DC Orders ED Discharge Orders          Ordered    meclizine (ANTIVERT) 25 MG tablet  3 times daily PRN        04/13/22 1929    ondansetron (ZOFRAN) 4 MG tablet  Every 8 hours PRN        04/13/22 1929              Turner Daniels 04/13/22 Gean Quint, MD 04/13/22 2330

## 2022-04-13 NOTE — ED Triage Notes (Signed)
Patient here POV from Home.  Endorses Dizziness and Nausea that began at 0530.  No Pain. No Known Fevers. No Diarrhea.   NAD Noted during Triage. A&Ox4. GCS 15. BIB Wheelchair.

## 2022-04-15 ENCOUNTER — Encounter: Payer: Self-pay | Admitting: Family Medicine

## 2022-04-20 ENCOUNTER — Telehealth (INDEPENDENT_AMBULATORY_CARE_PROVIDER_SITE_OTHER): Payer: BC Managed Care – PPO | Admitting: Family Medicine

## 2022-04-20 ENCOUNTER — Other Ambulatory Visit (HOSPITAL_COMMUNITY): Payer: Self-pay

## 2022-04-20 ENCOUNTER — Encounter: Payer: Self-pay | Admitting: Family Medicine

## 2022-04-20 VITALS — BP 138/91 | HR 106 | Wt 190.0 lb

## 2022-04-20 DIAGNOSIS — E1169 Type 2 diabetes mellitus with other specified complication: Secondary | ICD-10-CM

## 2022-04-20 DIAGNOSIS — F411 Generalized anxiety disorder: Secondary | ICD-10-CM

## 2022-04-20 DIAGNOSIS — F410 Panic disorder [episodic paroxysmal anxiety] without agoraphobia: Secondary | ICD-10-CM

## 2022-04-20 DIAGNOSIS — R42 Dizziness and giddiness: Secondary | ICD-10-CM

## 2022-04-20 MED ORDER — ONDANSETRON 4 MG PO TBDP
4.0000 mg | ORAL_TABLET | Freq: Three times a day (TID) | ORAL | 0 refills | Status: AC | PRN
Start: 2022-04-20 — End: ?

## 2022-04-20 MED ORDER — OZEMPIC (1 MG/DOSE) 4 MG/3ML ~~LOC~~ SOPN
1.0000 mg | PEN_INJECTOR | SUBCUTANEOUS | 5 refills | Status: DC
Start: 2022-04-20 — End: 2022-08-23
  Filled 2022-04-20: qty 3, 28d supply, fill #0

## 2022-04-20 MED ORDER — MECLIZINE HCL 25 MG PO CHEW: 25.0000 mg | CHEWABLE_TABLET | Freq: Three times a day (TID) | ORAL | 0 refills | Status: DC | PRN

## 2022-04-20 NOTE — Assessment & Plan Note (Signed)
Pt having difficulty finding his GLP medication, states his pharmacy was out of both mounjaro and ozempic, he has been taking his mother's medication. I will send a new rx for the Ozempic 1 mg weekly to the [facility] pharmacy to see if they have a supply of the medication. RTC in 1 month for A1C check.

## 2022-04-20 NOTE — Assessment & Plan Note (Signed)
Patient is taking the lexapro 5 mg daily, states his wife is noticing a decrease in his anxiety and he thinks it is helping some as well, no side effects reported from the medication. I encouraged the patient to continue his daily dose to help reduce anxiety attacks. RTC 1 month

## 2022-04-20 NOTE — Progress Notes (Signed)
Virtual Medical Office Visit  Patient:  Jonathan Gross      Age: 57 y.o.       Sex:  male  Date:   04/20/2022  PCP:    Karie Georges, MD   Today's Healthcare Provider: Karie Georges, MD    Assessment/Plan:   Summary assessment:  Reven was seen today for covid positive, dizziness and follow-up.  Vertigo  Assessment and Plan: Secondary to covid infection, now sx resolved. I will refill the ondansetron and the meclizine in case his symptoms return.  -     Meclizine HCl; Chew 1 tablet (25 mg total) by mouth 3 (three) times daily as needed.  Dispense: 30 tablet; Refill: 0 -     Ondansetron; Take 1 tablet (4 mg total) by mouth every 8 (eight) hours as needed for nausea or vomiting (for nausea from wegovy or other source).  Dispense: 20 tablet; Refill: 0  Type 2 diabetes mellitus with other specified complication, without long-term current use of insulin  Assessment & Plan: Pt having difficulty finding his GLP medication, states his pharmacy was out of both mounjaro and ozempic, he has been taking his mother's medication. I will send a new rx for the Ozempic 1 mg weekly to the [facility] pharmacy to see if they have a supply of the medication. RTC in 1 month for A1C check.  Orders: -     Ozempic (1 MG/DOSE); Inject 1 mg into the skin once a week.  Dispense: 3 mL; Refill: 5  Generalized anxiety disorder with panic attacks  Assessment & Plan: Patient is taking the lexapro 5 mg daily, states his wife is noticing a decrease in his anxiety and he thinks it is helping some as well, no side effects reported from the medication. I encouraged the patient to continue his daily dose to help reduce anxiety attacks. RTC 1 month      No follow-ups on file.   He was advised to call the office or go to ER if his condition worsens    Subjective:   Jonathan Gross is a 57 y.o. male with PMH significant for: Past Medical History:  Diagnosis Date   ANXIETY 08/05/2008   DIABETES  MELLITUS, TYPE II 09/08/2006   Family history of adverse reaction to anesthesia    father-has intubation issues-small throat, son-nausea and vomiting   GERD 09/08/2006   HYPERTENSION 09/08/2006   PANIC DISORDER, HX OF 10/30/2006     Presenting today with: Chief Complaint  Patient presents with   Covid Positive   Dizziness    Resolved today   Follow-up    ER follow up     He clarifies and reports that his condition: Anxiety-- patient reports that he is taking the 5 mg of lexapro daily, states that he feels better, his anxiety seems to be less.  Pt is reporting new onset of vertigo. States that it happened suddenly, states that the room is spinning, he couldn't stand up and he got very sick, threw up, had an online virtual visit, they had recommended he go to the hospital. States it happened about 1 week ago,he did go to the ER and they gave him zofran and meclizine, nausea did improve. States that the vertigo did also improve. States that they tested him and he was positive for COVID. Pt states that his symptoms at this point have mostly resolved. Pt is having his colonoscopy on Monday. Is taking the prep currently.  Pt reports his  pharmacy was out of the ozempic and out of the mounjaro-- has been taking his mother's medication.  He denies having any: Fever/chills, no chest pain or SOB, no nasal congestion.          Objective/Observations  Physical Exam:  Polite and friendly Gen: NAD, resting comfortably Pulm: Normal work of breathing Neuro: Grossly normal, moves all extremities Psych: Normal affect and thought content Problem specific physical exam findings:  N/A  No images are attached to the encounter or orders placed in the encounter.    Results: No results found for any visits on 04/20/22.        Virtual Visit via Video   I connected with Malcolm Metro on 04/20/22 at  8:00 AM EDT by a video enabled telemedicine application and verified that I am speaking with the  correct person using two identifiers. The limitations of evaluation and management by telemedicine and the availability of in person appointments were discussed. The patient expressed understanding and agreed to proceed.   Percentage of appointment time on video:  100% I spent 35 minutes discussing the patient's anxiety, recent COVID illness, vertigo and also the problems getting his GLP's due to drug shortages.  Patient location: Home Provider location: [place] [facility] Persons participating in the virtual visit: Myself and Patient

## 2022-04-23 ENCOUNTER — Other Ambulatory Visit: Payer: Self-pay | Admitting: Family Medicine

## 2022-04-23 DIAGNOSIS — F411 Generalized anxiety disorder: Secondary | ICD-10-CM

## 2022-04-24 ENCOUNTER — Encounter: Payer: Self-pay | Admitting: Certified Registered Nurse Anesthetist

## 2022-04-25 ENCOUNTER — Other Ambulatory Visit: Payer: Self-pay | Admitting: Family Medicine

## 2022-04-25 ENCOUNTER — Encounter: Payer: Self-pay | Admitting: Family Medicine

## 2022-04-25 ENCOUNTER — Ambulatory Visit (AMBULATORY_SURGERY_CENTER): Payer: BC Managed Care – PPO | Admitting: Gastroenterology

## 2022-04-25 ENCOUNTER — Encounter: Payer: Self-pay | Admitting: Gastroenterology

## 2022-04-25 VITALS — BP 120/90 | HR 92 | Temp 98.9°F | Resp 12 | Ht 67.25 in | Wt 195.0 lb

## 2022-04-25 DIAGNOSIS — Z86010 Personal history of colonic polyps: Secondary | ICD-10-CM

## 2022-04-25 DIAGNOSIS — Z1211 Encounter for screening for malignant neoplasm of colon: Secondary | ICD-10-CM | POA: Diagnosis not present

## 2022-04-25 DIAGNOSIS — Z09 Encounter for follow-up examination after completed treatment for conditions other than malignant neoplasm: Secondary | ICD-10-CM | POA: Diagnosis not present

## 2022-04-25 MED ORDER — SODIUM CHLORIDE 0.9 % IV SOLN
500.0000 mL | Freq: Once | INTRAVENOUS | Status: DC
Start: 2022-04-25 — End: 2022-04-25

## 2022-04-25 NOTE — Patient Instructions (Signed)
Please read handouts provided. Continue present medications. Repeat colonoscopy for screening in 10 years.   YOU HAD AN ENDOSCOPIC PROCEDURE TODAY AT THE [facility]:   Refer to the procedure report that was given to you for any specific questions about what was found during the examination.  If the procedure report does not answer your questions, please call your gastroenterologist to clarify.  If you requested that your care partner not be given the details of your procedure findings, then the procedure report has been included in a sealed envelope for you to review at your convenience later.  YOU SHOULD EXPECT: Some feelings of bloating in the abdomen. Passage of more gas than usual.  Walking can help get rid of the air that was put into your GI tract during the procedure and reduce the bloating. If you had a lower endoscopy (such as a colonoscopy or flexible sigmoidoscopy) you may notice spotting of blood in your stool or on the toilet paper. If you underwent a bowel prep for your procedure, you may not have a normal bowel movement for a few days.  Please Note:  You might notice some irritation and congestion in your nose or some drainage.  This is from the oxygen used during your procedure.  There is no need for concern and it should clear up in a day or so.  SYMPTOMS TO REPORT IMMEDIATELY:  Following lower endoscopy (colonoscopy or flexible sigmoidoscopy):  Excessive amounts of blood in the stool  Significant tenderness or worsening of abdominal pains  Swelling of the abdomen that is new, acute  Fever of 100F or higher   For urgent or emergent issues, a gastroenterologist can be reached at any hour by calling [phone]. Do not use MyChart messaging for urgent concerns.    DIET:  We do recommend a small meal at first, but then you may proceed to your regular diet.  Drink plenty of fluids but you should avoid alcoholic beverages for 24 hours.  ACTIVITY:  You should  plan to take it easy for the rest of today and you should NOT DRIVE or use heavy machinery until tomorrow (because of the sedation medicines used during the test).    FOLLOW UP: Our staff will call the number listed on your records the next business day following your procedure.  We will call around 7:15- 8:00 am to check on you and address any questions or concerns that you may have regarding the information given to you following your procedure. If we do not reach you, we will leave a message.     If any biopsies were taken you will be contacted by phone or by letter within the next 1-3 weeks.  Please call us at [phone] if you have not heard about the biopsies in 3 weeks.    SIGNATURES/CONFIDENTIALITY: You and/or your care partner have signed paperwork which will be entered into your electronic medical record.  These signatures attest to the fact that that the information above on your After Visit Summary has been reviewed and is understood.  Full responsibility of the confidentiality of this discharge information lies with you and/or your care-partner.

## 2022-04-25 NOTE — Progress Notes (Signed)
Pt's states no medical or surgical changes since previsit or office visit. 

## 2022-04-25 NOTE — Progress Notes (Signed)
[facility] History and Physical   Primary Care Physician:  Karie Georges, MD   Reason for Procedure:   History of colon polyps  Plan:    colonoscopy     HPI: Jonathan Gross is a 57 y.o. male  here for colonoscopy surveillance - history of adenoma removed 03/2015.  Patient denies any bowel symptoms at this time. No family history of colon cancer known. Otherwise feels well without any cardiopulmonary symptoms.   I have discussed risks / benefits of anesthesia and endoscopic procedure with Jonathan Gross and they wish to proceed with the exams as outlined today.    Past Medical History:  Diagnosis Date   ANXIETY 08/05/2008   DIABETES MELLITUS, TYPE II 09/08/2006   Family history of adverse reaction to anesthesia    father-has intubation issues-small throat, son-nausea and vomiting   GERD 09/08/2006   HYPERTENSION 09/08/2006   PANIC DISORDER, HX OF 10/30/2006    Past Surgical History:  Procedure Laterality Date   APPENDECTOMY  01/11/1995   COLONOSCOPY     ESOPHAGOGASTRODUODENOSCOPY (EGD) WITH PROPOFOL N/A 07/16/2015   Procedure: ESOPHAGOGASTRODUODENOSCOPY (EGD) WITH PROPOFOL;  Surgeon: Rachael Fee, MD;  Location: [facility];  Service: Endoscopy;  Laterality: N/A;   EUS N/A 07/16/2015   Procedure: UPPER ENDOSCOPIC ULTRASOUND (EUS) RADIAL;  Surgeon: Rachael Fee, MD;  Location: [facility];  Service: Endoscopy;  Laterality: N/A;   FINE NEEDLE ASPIRATION N/A 07/16/2015   Procedure: FINE NEEDLE ASPIRATION (FNA) LINEAR;  Surgeon: Rachael Fee, MD;  Location: [facility];  Service: Endoscopy;  Laterality: N/A;   TOE AMPUTATION Right 01/10/2006   great and 2nd toe   VASECTOMY  01/10/1997    Prior to Admission medications   Medication Sig Start Date End Date Taking? Authorizing Provider  aspirin 81 MG tablet Take 81 mg by mouth daily.   Yes [provider]  atorvastatin (LIPITOR) 10 MG tablet Take 1 tablet (10 mg total) by mouth daily.  11/11/21  Yes Karie Georges, MD  Cyanocobalamin (B-12 PO) Take 1,000 mg by mouth daily.   Yes [provider]  escitalopram (LEXAPRO) 5 MG tablet Take 5 mg by mouth daily.   Yes [provider]  FOLIC ACID PO Take 0.5 mg by mouth.   Yes [provider]  lisinopril (ZESTRIL) 20 MG tablet Take 1 tablet (20 mg total) by mouth daily. 12/15/21  Yes Karie Georges, MD  BD PEN NEEDLE NANO 2ND GEN 32G X 4 MM MISC USE AS DIRECTED 02/03/22   Karie Georges, MD  Continuous Blood Gluc Sensor (FREESTYLE LIBRE 14 DAY SENSOR) MISC APPLY EVERY 14 DAYS 11/18/21   Karie Georges, MD  Empagliflozin-metFORMIN HCl ER (SYNJARDY XR) 12.05-998 MG TB24 TAKE 2 TABLETS BY MOUTH WITH BREAKFAST 02/01/22   Karie Georges, MD  insulin glargine, 2 Unit Dial, (TOUJEO MAX SOLOSTAR) 300 UNIT/ML Solostar Pen Inject 30 Units into the skin at bedtime. 05/10/21   Wynn Banker, MD  LORazepam (ATIVAN) 0.5 MG tablet Take 1 tablet daily as needed for panic attack 02/18/22   Karie Georges, MD  Meclizine HCl 25 MG CHEW Chew 1 tablet (25 mg total) by mouth 3 (three) times daily as needed. 04/20/22   Karie Georges, MD  ondansetron (ZOFRAN-ODT) 4 MG disintegrating tablet Take 1 tablet (4 mg total) by mouth every 8 (eight) hours as needed for nausea or vomiting (for nausea from wegovy or other source). 04/20/22   Karie Georges,  MD  OZEMPIC, 1 MG/DOSE, 4 MG/3ML SOPN Inject 1 mg into the skin once a week. 04/20/22   Karie Georges, MD  sildenafil (VIAGRA) 50 MG tablet TAKE ONE TABLET BY MOUTH DAILY AS NEEDED FOR ERECTILE DYSFUNCTION 11/18/21   Karie Georges, MD  tirzepatide [facility]) 10 MG/0.5ML Pen Inject 10 mg into the skin once a week. 11/18/21   Karie Georges, MD  tirzepatide [facility]) 7.5 MG/0.5ML Pen Inject 7.5 mg into the skin once a week. 03/15/22   Karie Georges, MD    Current Outpatient Medications  Medication Sig Dispense Refill   aspirin 81 MG tablet Take 81 mg  by mouth daily.     atorvastatin (LIPITOR) 10 MG tablet Take 1 tablet (10 mg total) by mouth daily. 90 tablet 1   Cyanocobalamin (B-12 PO) Take 1,000 mg by mouth daily.     escitalopram (LEXAPRO) 5 MG tablet Take 5 mg by mouth daily.     FOLIC ACID PO Take 0.5 mg by mouth.     lisinopril (ZESTRIL) 20 MG tablet Take 1 tablet (20 mg total) by mouth daily. 90 tablet 3   BD PEN NEEDLE NANO 2ND GEN 32G X 4 MM MISC USE AS DIRECTED 300 each 1   Continuous Blood Gluc Sensor (FREESTYLE LIBRE 14 DAY SENSOR) MISC APPLY EVERY 14 DAYS 6 each 5   Empagliflozin-metFORMIN HCl ER (SYNJARDY XR) 12.05-998 MG TB24 TAKE 2 TABLETS BY MOUTH WITH BREAKFAST 180 tablet 3   insulin glargine, 2 Unit Dial, (TOUJEO MAX SOLOSTAR) 300 UNIT/ML Solostar Pen Inject 30 Units into the skin at bedtime. 27 mL 1   LORazepam (ATIVAN) 0.5 MG tablet Take 1 tablet daily as needed for panic attack 30 tablet 0   Meclizine HCl 25 MG CHEW Chew 1 tablet (25 mg total) by mouth 3 (three) times daily as needed. 30 tablet 0   ondansetron (ZOFRAN-ODT) 4 MG disintegrating tablet Take 1 tablet (4 mg total) by mouth every 8 (eight) hours as needed for nausea or vomiting (for nausea from wegovy or other source). 20 tablet 0   OZEMPIC, 1 MG/DOSE, 4 MG/3ML SOPN Inject 1 mg into the skin once a week. 3 mL 5   sildenafil (VIAGRA) 50 MG tablet TAKE ONE TABLET BY MOUTH DAILY AS NEEDED FOR ERECTILE DYSFUNCTION 10 tablet 5   tirzepatide (MOUNJARO) 10 MG/0.5ML Pen Inject 10 mg into the skin once a week. 6 mL 5   tirzepatide (MOUNJARO) 7.5 MG/0.5ML Pen Inject 7.5 mg into the skin once a week. 6 mL 1   Current Facility-Administered Medications  Medication Dose Route Frequency Provider Last Rate Last Admin   0.9 %  sodium chloride infusion  500 mL Intravenous Once Darius Fillingim, Willaim Rayas, MD        Allergies as of 04/25/2022 - Review Complete 04/25/2022  Allergen Reaction Noted   Amoxicillin Rash 08/10/2012    Family History  Problem Relation Age of Onset    Diabetes Mother    Rheum arthritis Mother    Depression Mother    High blood pressure Mother    Osteoarthritis Father    Heart disease Father 32   High Cholesterol Father    High blood pressure Father    Hypertension Sister    Hyperlipidemia Sister    Heart disease Sister        cabg age 62   Healthy Brother    Diabetes Maternal Grandmother    High blood pressure Maternal Grandmother    High Cholesterol  Maternal Grandmother    Heart attack Maternal Grandfather    Heart disease Maternal Grandfather    Heart disease Paternal Grandfather    Testicular cancer Son    Colon cancer Neg Hx    Colon polyps Neg Hx    Esophageal cancer Neg Hx    Rectal cancer Neg Hx    Stomach cancer Neg Hx     Social History   Socioeconomic History   Marital status: Married    Spouse name: Sue Lush   Number of children: 2   Years of education: College   Highest education level: Bachelor's degree (e.g., BA, AB, BS)  Occupational History   Occupation: [employer]  Tobacco Use   Smoking status: Former    Types: Cigarettes    Quit date: 01/10/1998    Years since quitting: 24.3   Smokeless tobacco: Never  Vaping Use   Vaping Use: Never used  Substance and Sexual Activity   Alcohol use: No    Alcohol/week: 0.0 standard drinks of alcohol   Drug use: No   Sexual activity: Not on file  Other Topics Concern   Not on file  Social History Narrative   Lives with Alcides Nutting (wife)   Caffeine use: soda/coffee daily   Social Determinants of Health   Financial Resource Strain: Low Risk  (02/14/2022)   Overall Financial Resource Strain (CARDIA)    Difficulty of Paying Living Expenses: Not hard at all  Food Insecurity: No Food Insecurity (02/14/2022)   Hunger Vital Sign    Worried About Running Out of Food in the Last Year: Never true    Ran Out of Food in the Last Year: Never true  Transportation Needs: No Transportation Needs (02/14/2022)   PRAPARE - Administrator, Civil Service (Medical): No     Lack of Transportation (Non-Medical): No  Physical Activity: Insufficiently Active (02/14/2022)   Exercise Vital Sign    Days of Exercise per Week: 2 days    Minutes of Exercise per Session: 20 min  Stress: Stress Concern Present (02/14/2022)   [employer] of Occupational Health - Occupational Stress Questionnaire    Feeling of Stress : Very much  Social Connections: Unknown (02/14/2022)   Social Connection and Isolation Panel [NHANES]    Frequency of Communication with Friends and Family: More than three times a week    Frequency of Social Gatherings with Friends and Family: Three times a week    Attends Religious Services: Patient declined    Active Member of Clubs or Organizations: No    Attends Engineer, structural: Not on file    Marital Status: Married  Catering manager Violence: Not on file    Review of Systems: All other review of systems negative except as mentioned in the HPI.  Physical Exam: Vital signs BP (!) 142/94   Pulse (!) 102   Temp 98.9 F (37.2 C)   Resp 16   Ht 5' 7.25" (1.708 m)   Wt 195 lb (88.5 kg)   SpO2 98%   BMI 30.31 kg/m   General:   Alert,  Well-developed, pleasant and cooperative in NAD Lungs:  Clear throughout to auscultation.   Heart:  Regular rate and rhythm Abdomen:  Soft, nontender and nondistended.   Neuro/Psych:  Alert and cooperative. Normal mood and affect. A and O x 3  Harlin Rain, MD [facility]

## 2022-04-25 NOTE — Op Note (Signed)
[facility] Patient Name: Jonathan Gross Procedure Date: 04/25/2022 7:54 AM MRN: [id number] Endoscopist: Viviann Spare P. Adela Lank , MD, [id number] Age: 57 Referring MD:  Date of Birth: [birth year] Gender: Male Account #: [id number] Procedure:                Colonoscopy Indications:              High risk colon cancer surveillance: Personal                            history of colonic polyps - adenoma removed 03/2015 Medicines:                Monitored Anesthesia Care Procedure:                Pre-Anesthesia Assessment:                           - Prior to the procedure, a History and Physical                            was performed, and patient medications and                            allergies were reviewed. The patient's tolerance of                            previous anesthesia was also reviewed. The risks                            and benefits of the procedure and the sedation                            options and risks were discussed with the patient.                            All questions were answered, and informed consent                            was obtained. Prior Anticoagulants: The patient has                            taken no anticoagulant or antiplatelet agents. ASA                            Grade Assessment: II - A patient with mild systemic                            disease. After reviewing the risks and benefits,                            the patient was deemed in satisfactory condition to                            undergo the procedure.  After obtaining informed consent, the colonoscope                            was passed under direct vision. Throughout the                            procedure, the patient's blood pressure, pulse, and                            oxygen saturations were monitored continuously. The                            CF [id number] #[id number] was introduced through the anus                            and  advanced to the the cecum, identified by                            appendiceal orifice and ileocecal valve. The                            colonoscopy was performed without difficulty. The                            patient tolerated the procedure well. The quality                            of the bowel preparation was good. The ileocecal                            valve, appendiceal orifice, and rectum were                            photographed. Scope In: 8:02:40 AM Scope Out: 8:19:30 AM Scope Withdrawal Time: 0 hours 13 minutes 31 seconds  Total Procedure Duration: 0 hours 16 minutes 50 seconds  Findings:                 The perianal and digital rectal examinations were                            normal.                           Multiple small-mouthed diverticula were found in                            the sigmoid colon.                           Internal hemorrhoids were found during retroflexion.                           The exam was otherwise without abnormality. Complications:            No immediate complications. Estimated blood loss:  Minimal. Estimated Blood Loss:     Estimated blood loss was minimal. Impression:               - Diverticulosis in the sigmoid colon.                           - Internal hemorrhoids.                           - The examination was otherwise normal.                           - No specimens collected.                           - The GI Genius (intelligent endoscopy module),                            computer-aided polyp detection system powered by AI                            was utilized to detect colorectal polyps through                            enhanced visualization during colonoscopy. Recommendation:           - Patient has a contact number available for                            emergencies. The signs and symptoms of potential                            delayed complications were discussed with the                             patient. Return to normal activities tomorrow.                            Written discharge instructions were provided to the                            patient.                           - Resume previous diet.                           - Continue present medications.                           - Repeat colonoscopy in 10 years for surveillance.                           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 Will discuss coordinating follow up                            EUS with my advanced endoscopy colleagues Viviann Spare P. Laticha Ferrucci, MD 04/25/2022 8:25:12 AM This report has been signed electronically.

## 2022-04-26 ENCOUNTER — Telehealth: Payer: Self-pay

## 2022-04-26 ENCOUNTER — Other Ambulatory Visit: Payer: Self-pay

## 2022-04-26 DIAGNOSIS — K319 Disease of stomach and duodenum, unspecified: Secondary | ICD-10-CM

## 2022-04-26 NOTE — Telephone Encounter (Signed)
  Follow up Call-     04/25/2022    7:25 AM  Call back number  Post procedure Call Back phone  # [phone]  Permission to leave phone message Yes     Patient questions:  Do you have a fever, pain , or abdominal swelling? No. Pain Score  0 *  Have you tolerated food without any problems? Yes.    Have you been able to return to your normal activities? Yes.    Do you have any questions about your discharge instructions: Diet   No. Medications  No. Follow up visit  No.  Do you have questions or concerns about your Care? No.  Actions: * If pain score is 4 or above: No action needed, pain <4.

## 2022-04-26 NOTE — Telephone Encounter (Signed)
Patient returned the call would like to discuss further the appt that was made for EUS.

## 2022-04-26 NOTE — Telephone Encounter (Signed)
-----   Message from Lemar Lofty., MD sent at 04/25/2022  5:11 PM EDT ----- Regarding: RE: follow up EUS SA, Thanks for reaching out. Agree he needs an EUS.  Adlai Sinning, Please schedule this patient for EUS as able in the next 2 to 3 months. Thanks. GM  ----- Message ----- From: Benancio Deeds, MD Sent: 04/25/2022   4:23 PM EDT To: Loretha Stapler, RN; Lemar Lofty., MD Subject: follow up EUS                                  Gabe this patient was lost to follow up, Jesusita Oka did an EUS for him in 2017 for a subepithelial gastric lesion, had recommended a follow up exam in one year and he never had it done. I did his colonoscopy today and saw it in his chart - he is agreeable to EUS with you if you are okay with direct booking it. If so, Alexia Freestone can you schedule? Patient is aware and agrees. Thanks  Brett Canales

## 2022-04-26 NOTE — Telephone Encounter (Signed)
EUS scheduled, pt instructed and medications reviewed.  Patient instructions mailed to home.  Patient to call with any questions or concerns.  

## 2022-04-26 NOTE — Telephone Encounter (Signed)
EUS has been scheduled for 06/16/22 at 11 am with GM at [facility]   Left message on machine to call back   Pt on Ozempic/Mounjaro  and will need to stop for 7 days

## 2022-05-04 ENCOUNTER — Other Ambulatory Visit (HOSPITAL_COMMUNITY): Payer: Self-pay

## 2022-05-05 ENCOUNTER — Other Ambulatory Visit (HOSPITAL_COMMUNITY): Payer: Self-pay

## 2022-05-05 ENCOUNTER — Encounter (HOSPITAL_COMMUNITY): Payer: Self-pay

## 2022-05-20 ENCOUNTER — Encounter: Payer: Self-pay | Admitting: Family Medicine

## 2022-05-20 ENCOUNTER — Ambulatory Visit (INDEPENDENT_AMBULATORY_CARE_PROVIDER_SITE_OTHER): Payer: BC Managed Care – PPO | Admitting: Family Medicine

## 2022-05-20 ENCOUNTER — Telehealth: Payer: Self-pay

## 2022-05-20 VITALS — BP 126/88 | HR 80 | Temp 98.3°F | Ht 67.25 in | Wt 199.0 lb

## 2022-05-20 DIAGNOSIS — Z7985 Long-term (current) use of injectable non-insulin antidiabetic drugs: Secondary | ICD-10-CM

## 2022-05-20 DIAGNOSIS — E1169 Type 2 diabetes mellitus with other specified complication: Secondary | ICD-10-CM | POA: Diagnosis not present

## 2022-05-20 DIAGNOSIS — R42 Dizziness and giddiness: Secondary | ICD-10-CM | POA: Diagnosis not present

## 2022-05-20 LAB — POCT GLYCOSYLATED HEMOGLOBIN (HGB A1C): Hemoglobin A1C: 8.4 % — AB (ref 4.0–5.6)

## 2022-05-20 NOTE — Telephone Encounter (Signed)
Per pharmacy Ozempic requires PA.  PA started via CMM A1C results uploaded Key: [id number]

## 2022-05-20 NOTE — Assessment & Plan Note (Signed)
A1C slightly worse than previous due to difficulty with medications due to the problems with his vertigo. Will continue current meds and follow up in 3 months

## 2022-05-20 NOTE — Progress Notes (Signed)
Established Patient Office Visit  Subjective   Patient ID: Jonathan Gross, male    DOB: 1965/09/28  Age: 57 y.o. MRN: [id number]  Chief Complaint  Patient presents with   Medical Management of Chronic Issues    Patient states he continues to have vertigo, states that it is worse at night and he is drowsy in the morning. States that the vertigo has thrown everything off, including taking his medication and adhering to his diet. States he knows that his A1C is going to be higher than previous. Has been taking the 1 mg weekly ozempic but he is nervous about increasing the dose at this time.     Current Outpatient Medications  Medication Instructions   aspirin 81 mg, Oral, Daily   atorvastatin (LIPITOR) 10 mg, Oral, Daily   BD PEN NEEDLE NANO 2ND GEN 32G X 4 MM MISC See admin instructions   Continuous Blood Gluc Sensor (FREESTYLE LIBRE 14 DAY SENSOR) MISC APPLY EVERY 14 DAYS   Cyanocobalamin (B-12 PO) 1,000 mg, Oral, Daily   Empagliflozin-metFORMIN HCl ER (SYNJARDY XR) 12.05-998 MG TB24 TAKE 2 TABLETS BY MOUTH WITH BREAKFAST   escitalopram (LEXAPRO) 5 mg, Oral, Daily   FOLIC ACID PO 0.5 mg, Oral   lisinopril (ZESTRIL) 20 mg, Oral, Daily   LORazepam (ATIVAN) 0.5 MG tablet Take 1 tablet daily as needed for panic attack   Meclizine HCl 25 mg, Oral, 3 times daily PRN   ondansetron (ZOFRAN-ODT) 4 mg, Oral, Every 8 hours PRN   Ozempic (1 MG/DOSE) 1 mg, Subcutaneous, Weekly   sildenafil (VIAGRA) 50 MG tablet TAKE ONE TABLET BY MOUTH DAILY AS NEEDED FOR ERECTILE DYSFUNCTION   Toujeo Max SoloStar 30 Units, Subcutaneous, Nightly    Patient Active Problem List   Diagnosis Date Noted   Vertigo 05/20/2022   Difficulty sleeping 04/04/2022   Pure hypercholesterolemia 04/04/2022   Anxiety 03/22/2022   Long term current use of insulin (HCC) 03/22/2022   Type 2 diabetes mellitus with hyperglycemia (HCC) 11/18/2021   Hyperlipidemia associated with type 2 diabetes mellitus (HCC) 10/21/2019    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     Objective:     BP 126/88 (BP Location: Left Arm, Patient Position: Sitting, Cuff Size: Normal)   Pulse 80   Temp 98.3 F (36.8 C) (Oral)   Ht 5' 7.25" (1.708 m)   Wt 199 lb (90.3 kg)   SpO2 98%   BMI 30.94 kg/m    Physical Exam Vitals reviewed.  Constitutional:      Appearance: Normal appearance. He is well-groomed and normal weight.  Eyes:     Conjunctiva/sclera: Conjunctivae normal.  Cardiovascular:     Rate and Rhythm: Normal rate and regular rhythm.     Heart sounds: S1 normal and S2 normal. No murmur heard. Pulmonary:     Effort: Pulmonary effort is normal.     Breath sounds: Normal breath sounds and air entry. No rales.  Neurological:     General: No focal deficit present.     Mental Status: He is alert and oriented to person, place, and time.     Gait: Gait is intact.  Psychiatric:        Mood and Affect: Mood and  affect normal.      Results for orders placed or performed in visit on 05/20/22  POC HgB A1c  Result Value Ref Range   Hemoglobin A1C 8.4 (A) 4.0 - 5.6 %   HbA1c POC (<> result, manual entry)     HbA1c, POC (prediabetic range)     HbA1c, POC (controlled diabetic range)        The ASCVD Risk score (Arnett DK, et al., 2019) failed to calculate for the following reasons:   The valid total cholesterol range is 130 to 320 mg/dL    Assessment & Plan:  Type 2 diabetes mellitus with other specified complication, without long-term current use of insulin (HCC) Assessment & Plan: A1C slightly worse than previous due to difficulty with medications due to the problems with his vertigo. Will continue current meds and follow up in 3  months  Orders: -     POCT glycosylated hemoglobin (Hb A1C)  Vertigo Assessment & Plan: Persistent, intermittent and is associated with turning his head more often. Will send to physical therapy for evaluation and treatment. I did give him handouts on the Epley maneuvers to try at home.   Orders: -     Ambulatory referral to Physical Therapy     Return in about 3 months (around 08/20/2022) for DM, please be fasting for blood work.    Karie Georges, MD

## 2022-05-20 NOTE — Assessment & Plan Note (Signed)
Persistent, intermittent and is associated with turning his head more often. Will send to physical therapy for evaluation and treatment. I did give him handouts on the Epley maneuvers to try at home.

## 2022-05-30 DIAGNOSIS — H8110 Benign paroxysmal vertigo, unspecified ear: Secondary | ICD-10-CM | POA: Diagnosis not present

## 2022-06-01 DIAGNOSIS — H8110 Benign paroxysmal vertigo, unspecified ear: Secondary | ICD-10-CM | POA: Diagnosis not present

## 2022-06-09 ENCOUNTER — Encounter (HOSPITAL_COMMUNITY): Payer: Self-pay | Admitting: Gastroenterology

## 2022-06-09 NOTE — Progress Notes (Signed)
Attempted to obtain medical history via telephone, unable to reach at this time. HIPAA compliant voicemail message left requesting return call to pre surgical testing department. 

## 2022-06-11 ENCOUNTER — Other Ambulatory Visit (HOSPITAL_COMMUNITY): Payer: Self-pay

## 2022-06-11 NOTE — Telephone Encounter (Signed)
Pharmacy Patient Advocate Encounter  Prior Authorization for Ozempic (1 MG/DOSE) 4MG /3ML pen-injectors has been APPROVED by [employer] from 05/20/2022 to 05/20/2023.   PA #  [id number] Copay is $0.00

## 2022-06-14 DIAGNOSIS — H8110 Benign paroxysmal vertigo, unspecified ear: Secondary | ICD-10-CM | POA: Diagnosis not present

## 2022-06-15 NOTE — Anesthesia Preprocedure Evaluation (Addendum)
Anesthesia Evaluation  Patient identified by MRN, date of birth, ID band Patient awake    Reviewed: Allergy & Precautions, NPO status , Patient's Chart, lab work & pertinent test results  History of Anesthesia Complications (+) Family history of anesthesia reaction and history of anesthetic complications  Airway Mallampati: II  TM Distance: >3 FB Neck ROM: Full    Dental no notable dental hx. (+) Teeth Intact, Dental Advisory Given   Pulmonary former smoker   Pulmonary exam normal breath sounds clear to auscultation       Cardiovascular hypertension, Normal cardiovascular exam Rhythm:Regular Rate:Normal     Neuro/Psych  PSYCHIATRIC DISORDERS Anxiety        GI/Hepatic ,GERD  ,,  Endo/Other  diabetes    Renal/GU      Musculoskeletal   Abdominal   Peds  Hematology   Anesthesia Other Findings All: Amoxicillin  Reproductive/Obstetrics                             Anesthesia Physical Anesthesia Plan  ASA: 3  Anesthesia Plan: MAC   Post-op Pain Management: Minimal or no pain anticipated   Induction:   PONV Risk Score and Plan: Treatment may vary due to age or medical condition and Propofol infusion  Airway Management Planned: Simple Face Mask and Nasal Cannula  Additional Equipment: None  Intra-op Plan:   Post-operative Plan:   Informed Consent: I have reviewed the patients History and Physical, chart, labs and discussed the procedure including the risks, benefits and alternatives for the proposed anesthesia with the patient or authorized representative who has indicated his/her understanding and acceptance.     Dental advisory given  Plan Discussed with:   Anesthesia Plan Comments: (Upper endoscopic ultrasound for gastric leison)        Anesthesia Quick Evaluation

## 2022-06-16 ENCOUNTER — Ambulatory Visit (HOSPITAL_COMMUNITY): Payer: BC Managed Care – PPO | Admitting: Anesthesiology

## 2022-06-16 ENCOUNTER — Encounter (HOSPITAL_COMMUNITY): Payer: Self-pay | Admitting: Gastroenterology

## 2022-06-16 ENCOUNTER — Encounter (HOSPITAL_COMMUNITY)
Admission: RE | Disposition: A | Discharge status: Home / Self Care | Payer: Self-pay | Source: Ambulatory Visit | Attending: Gastroenterology

## 2022-06-16 ENCOUNTER — Ambulatory Visit (HOSPITAL_COMMUNITY)
Admission: RE | Admit: 2022-06-16 | Discharge: 2022-06-16 | Disposition: A | Discharge status: Home / Self Care | Payer: BC Managed Care – PPO | Source: Ambulatory Visit | Attending: Gastroenterology | Admitting: Gastroenterology

## 2022-06-16 ENCOUNTER — Other Ambulatory Visit: Payer: Self-pay

## 2022-06-16 DIAGNOSIS — K2289 Other specified disease of esophagus: Secondary | ICD-10-CM

## 2022-06-16 DIAGNOSIS — Z87891 Personal history of nicotine dependence: Secondary | ICD-10-CM | POA: Diagnosis not present

## 2022-06-16 DIAGNOSIS — K3189 Other diseases of stomach and duodenum: Secondary | ICD-10-CM

## 2022-06-16 DIAGNOSIS — K319 Disease of stomach and duodenum, unspecified: Secondary | ICD-10-CM | POA: Diagnosis not present

## 2022-06-16 DIAGNOSIS — I10 Essential (primary) hypertension: Secondary | ICD-10-CM | POA: Diagnosis not present

## 2022-06-16 DIAGNOSIS — K2970 Gastritis, unspecified, without bleeding: Secondary | ICD-10-CM | POA: Diagnosis not present

## 2022-06-16 HISTORY — PX: EUS: SHX5427

## 2022-06-16 HISTORY — PX: ESOPHAGOGASTRODUODENOSCOPY (EGD) WITH PROPOFOL: SHX5813

## 2022-06-16 HISTORY — PX: FINE NEEDLE ASPIRATION: SHX5430

## 2022-06-16 HISTORY — PX: BIOPSY: SHX5522

## 2022-06-16 LAB — GLUCOSE, CAPILLARY: Glucose-Capillary: 209 mg/dL — ABNORMAL HIGH (ref 70–99)

## 2022-06-16 SURGERY — ESOPHAGOGASTRODUODENOSCOPY (EGD) WITH PROPOFOL
Anesthesia: Monitor Anesthesia Care

## 2022-06-16 MED ORDER — LACTATED RINGERS IV SOLN: INTRAVENOUS | Status: DC | PRN

## 2022-06-16 MED ORDER — GLYCOPYRROLATE 0.2 MG/ML IJ SOLN
INTRAMUSCULAR | Status: DC | PRN
  Administered 2022-06-16: .1 mg via INTRAVENOUS

## 2022-06-16 MED ORDER — LACTATED RINGERS IV SOLN: INTRAVENOUS | Status: DC

## 2022-06-16 MED ORDER — PROPOFOL 500 MG/50ML IV EMUL
INTRAVENOUS | Status: DC | PRN
  Administered 2022-06-16: 100 ug/kg/min via INTRAVENOUS

## 2022-06-16 MED ORDER — SODIUM CHLORIDE 0.9 % IV SOLN: INTRAVENOUS | Status: DC

## 2022-06-16 MED ORDER — DEXMEDETOMIDINE HCL IN NACL 80 MCG/20ML IV SOLN
INTRAVENOUS | Status: DC | PRN
  Administered 2022-06-16: 8 ug via INTRAVENOUS

## 2022-06-16 NOTE — Discharge Instructions (Signed)
YOU HAD AN ENDOSCOPIC PROCEDURE TODAY: Refer to the procedure report and other information in the discharge instructions given to you for any specific questions about what was found during the examination. If this information does not answer your questions, please call [place] office at [phone] to clarify.  ° °YOU SHOULD EXPECT: Some feelings of bloating in the abdomen. Passage of more gas than usual. Walking can help get rid of the air that was put into your GI tract during the procedure and reduce the bloating. If you had a lower endoscopy (such as a colonoscopy or flexible sigmoidoscopy) you may notice spotting of blood in your stool or on the toilet paper. Some abdominal soreness may be present for a day or two, also. ° °DIET: Your first meal following the procedure should be a light meal and then it is ok to progress to your normal diet. A half-sandwich or bowl of soup is an example of a good first meal. Heavy or fried foods are harder to digest and may make you feel nauseous or bloated. Drink plenty of fluids but you should avoid alcoholic beverages for 24 hours. If you had a esophageal dilation, please see attached instructions for diet.   ° °ACTIVITY: Your care partner should take you home directly after the procedure. You should plan to take it easy, moving slowly for the rest of the day. You can resume normal activity the day after the procedure however YOU SHOULD NOT DRIVE, use power tools, machinery or perform tasks that involve climbing or major physical exertion for 24 hours (because of the sedation medicines used during the test).  ° °SYMPTOMS TO REPORT IMMEDIATELY: °A gastroenterologist can be reached at any hour. Please call [phone]  for any of the following symptoms:  °Following lower endoscopy (colonoscopy, flexible sigmoidoscopy) °Excessive amounts of blood in the stool  °Significant tenderness, worsening of abdominal pains  °Swelling of the abdomen that is new, acute  °Fever of 100° or  higher  °Following upper endoscopy (EGD, EUS, ERCP, esophageal dilation) °Vomiting of blood or coffee ground material  °New, significant abdominal pain  °New, significant chest pain or pain under the shoulder blades  °Painful or persistently difficult swallowing  °New shortness of breath  °Black, tarry-looking or red, bloody stools ° °FOLLOW UP:  °If any biopsies were taken you will be contacted by phone or by letter within the next 1-3 weeks. Call [phone]  if you have not heard about the biopsies in 3 weeks.  °Please also call with any specific questions about appointments or follow up tests. ° °

## 2022-06-16 NOTE — Anesthesia Postprocedure Evaluation (Signed)
Anesthesia Post Note  Patient: Jonathan Gross  Procedure(s) Performed: UPPER ENDOSCOPIC ULTRASOUND (EUS) RADIAL ESOPHAGOGASTRODUODENOSCOPY (EGD) WITH PROPOFOL BIOPSY FINE NEEDLE ASPIRATION (FNA) LINEAR UPPER ENDOSCOPIC ULTRASOUND (EUS) LINEAR     Patient location during evaluation: Endoscopy Anesthesia Type: MAC Level of consciousness: awake and alert Pain management: pain level controlled Vital Signs Assessment: post-procedure vital signs reviewed and stable Respiratory status: spontaneous breathing, nonlabored ventilation, respiratory function stable and patient connected to nasal cannula oxygen Cardiovascular status: blood pressure returned to baseline and stable Postop Assessment: no apparent nausea or vomiting Anesthetic complications: no   No notable events documented.  Last Vitals:  Vitals:   06/16/22 1130 06/16/22 1140  BP: 130/86 (!) 137/93  Pulse: 76 71  Resp: 13 13  Temp:    SpO2: 96% 97%    Last Pain:  Vitals:   06/16/22 1140  TempSrc:   PainSc: 0-No pain                 Trevor Iha

## 2022-06-16 NOTE — Transfer of Care (Signed)
Immediate Anesthesia Transfer of Care Note  Patient: Jonathan Gross  Procedure(s) Performed: UPPER ENDOSCOPIC ULTRASOUND (EUS) RADIAL ESOPHAGOGASTRODUODENOSCOPY (EGD) WITH PROPOFOL BIOPSY FINE NEEDLE ASPIRATION (FNA) LINEAR UPPER ENDOSCOPIC ULTRASOUND (EUS) LINEAR  Patient Location: PACU  Anesthesia Type:General  Level of Consciousness: awake and alert   Airway & Oxygen Therapy: Patient Spontanous Breathing and Patient connected to nasal cannula oxygen  Post-op Assessment: Post -op Vital signs reviewed and stable  Post vital signs: Reviewed and stable  Last Vitals:  Vitals Value Taken Time  BP 132/82 06/16/22 1113  Temp    Pulse 75 06/16/22 1114  Resp 12 06/16/22 1114  SpO2 100 % 06/16/22 1114  Vitals shown include unvalidated device data.  Last Pain:  Vitals:   06/16/22 0944  TempSrc: Temporal  PainSc: 0-No pain         Complications: No notable events documented.

## 2022-06-16 NOTE — Op Note (Signed)
[facility] Patient Name: Jonathan Gross Procedure Date: 06/16/2022 MRN: [id number] Attending MD: Corliss Parish , MD, [id number] Date of Birth: [birth year] CSN: [id number] Age: 57 Admit Type: Outpatient Procedure:                Upper EUS Indications:              Gastric deformity on endoscopy/Subepithelial tumor                            versus extrinsic compression Providers:                Corliss Parish, MD, Zoe Lan, RN, Harrington Challenger, Technician Referring MD:             Willaim Rayas. Adela Lank, MD, Vinetta Bergamo. Casimiro Needle Medicines:                Monitored Anesthesia Care Complications:            No immediate complications. Estimated Blood Loss:     Estimated blood loss was minimal. Procedure:                Pre-Anesthesia Assessment:                           - Prior to the procedure, a History and Physical                            was performed, and patient medications and                            allergies were reviewed. The patient's tolerance of                            previous anesthesia was also reviewed. The risks                            and benefits of the procedure and the sedation                            options and risks were discussed with the patient.                            All questions were answered, and informed consent                            was obtained. Prior Anticoagulants: The patient has                            taken no anticoagulant or antiplatelet agents. ASA                            Grade Assessment: II - A patient with mild systemic  disease. After reviewing the risks and benefits,                            the patient was deemed in satisfactory condition to                            undergo the procedure.                           After obtaining informed consent, the endoscope was                            passed under direct vision. Throughout the                             procedure, the patient's blood pressure, pulse, and                            oxygen saturations were monitored continuously. The                            [id number]-H190 ([id number]) Olympus endoscope was introduced                            through the mouth, and advanced to the second part                            of duodenum. The GF-UE190-AL5 ([id number]) Olympus                            radial ultrasound scope was introduced through the                            mouth, and advanced to the stomach for ultrasound                            examination from the esophagus and stomach. The                            [id number] ([id number]) Olympus linear ultrasound scope                            was introduced through the mouth, and advanced to                            the stomach for ultrasound examination from the                            esophagus and stomach. The upper EUS was                            accomplished without difficulty. The patient  tolerated the procedure. Scope In: Scope Out: Findings:      ENDOSCOPIC FINDING: :      No gross lesions were noted in the entire esophagus.      The Z-line was irregular and was found 40 cm from the incisors.      A single 16 mm subepithelial papule (nodule) with no bleeding and no       stigmata of recent bleeding was found in the cardia.      Patchy mild inflammation characterized by erythema and friability was       found in the gastric antrum.      No gross lesions were noted in the entire examined stomach. Biopsies       were taken with a cold forceps for histology and Helicobacter pylori       testing.      No gross lesions were noted in the duodenal bulb, in the first portion       of the duodenum and in the second portion of the duodenum.      ENDOSONOGRAPHIC FINDING: :      An oval intramural (subepithelial) lesion was found in the cardia of the       stomach. It was encountered at  3 cm distal to the gastroesophageal       junction. The lesion was mostly isoechoic but there was a little bit       more hypoechogenicity noted. Sonographically, the lesion appeared to       originate from the serosa (Layer 5) as it was underneath the muscularis       propria. The lesion measured 17 mm (in maximum thickness). The lesion       also measured 14 mm in diameter. The outer endosonographic borders were       well defined. Fine needle biopsy was performed. Color Doppler imaging       was utilized prior to needle puncture to confirm a lack of significant       vascular structures within the needle path. Six passes were made with       the 22 gauge Acquire biopsy needle using a transgastric approach. A       visible core of tissue was obtained. Preliminary cytologic examination       and touch preps were performed. Final cytology results are pending.      Endosonographic imaging in the fundus of the stomach and in the body of       the stomach showed no wall thickening.      Endosonographic imaging in the visualized portion of the liver showed no       mass.      The celiac region was visualized. Impression:               EGD impression:                           - No gross lesions in the entire esophagus. Z-line                            irregular, 40 cm from the incisors.                           - A single subepithelial papule (nodule) found in  the stomach.                           - Antral gastritis. - No gross lesions in the                            entire stomach. Biopsied.                           - No gross lesions in the duodenal bulb, in the                            first portion of the duodenum and in the second                            portion of the duodenum.                           EUS impression:                           - An intramural (subepithelial) lesion was found in                            the cardia of the stomach.  The lesion appeared to                            originate from within the serosa (Layer 5).                            Cytology results are pending. 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 Fine needle                            biopsy performed. Moderate Sedation:      Not Applicable - Patient had care per Anesthesia. Recommendation:           - The patient will be observed post-procedure,                            until all discharge criteria are met.                           - Discharge patient to home.                           - Patient has a contact number available for                            emergencies. The signs and symptoms of potential  delayed complications were discussed with the                            patient. Return to normal activities tomorrow.                            Written discharge instructions were provided to the                            patient.                           - Resume previous diet.                           - Await cytology results and await path results.                           - Observe patient's clinical course.                           - Based on results will need to consider if this                            does show evidence of being a spindle cell                            lesion/GIST, we will likely discuss with Foregut                            surgeons as to whether continued                            monitoring/evaluation is reasonable since it                            remains less than 2 cm.                           - If cytology is negative or not definitive, then                            certainly repeat EUS in 6 to 12 months makes sense                            with forward-viewing EUS and larger bore needle to                            try and get more tissue if  possible. Thankfully,                            lesion does not look like it has changed in size  over 7 years.                           - The findings and recommendations were discussed                            with the patient.                           - The findings and recommendations were discussed                            with the patient's family. Procedure Code(s):        --- Professional ---                           [phone], Esophagogastroduodenoscopy, flexible,                            transoral; with transendoscopic ultrasound-guided                            intramural or transmural fine needle                            aspiration/biopsy(s), (includes endoscopic                            ultrasound examination limited to the esophagus,                            stomach or duodenum, and adjacent structures)                           43239, 59, Esophagogastroduodenoscopy, flexible,                            transoral; with biopsy, single or multiple Diagnosis Code(s):        --- Professional ---                           K22.89, Other specified disease of esophagus                           K29.70, Gastritis, unspecified, without bleeding                           K31.89, Other diseases of stomach and duodenum CPT copyright 2022 American Medical Association. All rights reserved. The codes documented in this report are preliminary and upon coder review may  be revised to meet current compliance requirements. Corliss Parish, MD 06/16/2022 11:25:02 AM Number of Addenda: 0

## 2022-06-16 NOTE — H&P (Signed)
GASTROENTEROLOGY PROCEDURE H&P NOTE   Primary Care Physician: Karie Georges, MD  HPI: Jonathan Gross is a 57 y.o. 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  Past Medical History:  Diagnosis Date   ANXIETY 08/05/2008   DIABETES MELLITUS, TYPE II 09/08/2006   Family history of adverse reaction to anesthesia    father-has intubation issues-small throat, son-nausea and vomiting   GERD 09/08/2006   HYPERTENSION 09/08/2006   PANIC DISORDER, HX OF 10/30/2006   Past Surgical History:  Procedure Laterality Date   APPENDECTOMY  01/11/1995   COLONOSCOPY     ESOPHAGOGASTRODUODENOSCOPY (EGD) WITH PROPOFOL N/A 07/16/2015   Procedure: ESOPHAGOGASTRODUODENOSCOPY (EGD) WITH PROPOFOL;  Surgeon: Rachael Fee, MD;  Location: [facility];  Service: Endoscopy;  Laterality: N/A;   EUS N/A 07/16/2015   Procedure: UPPER ENDOSCOPIC ULTRASOUND (EUS) RADIAL;  Surgeon: Rachael Fee, MD;  Location: [facility];  Service: Endoscopy;  Laterality: N/A;   FINE NEEDLE ASPIRATION N/A 07/16/2015   Procedure: FINE NEEDLE ASPIRATION (FNA) LINEAR;  Surgeon: Rachael Fee, MD;  Location: [facility];  Service: Endoscopy;  Laterality: N/A;   TOE AMPUTATION Right 01/10/2006   great and 2nd toe   VASECTOMY  01/10/1997   Current Facility-Administered Medications  Medication Dose Route Frequency Provider Last Rate Last Admin   0.9 %  sodium chloride infusion   Intravenous Continuous Mansouraty, Netty Starring., MD       lactated ringers infusion   Intravenous Continuous Mansouraty, Netty Starring., MD        Current Facility-Administered Medications:    0.9 %  sodium chloride infusion, , Intravenous, Continuous, Mansouraty, Netty Starring., MD   lactated ringers infusion, , Intravenous, Continuous, Mansouraty, Netty Starring., MD Allergies  Allergen Reactions   Amoxicillin Rash   Family History  Problem  Relation Age of Onset   Diabetes Mother    Rheum arthritis Mother    Depression Mother    High blood pressure Mother    Osteoarthritis Father    Heart disease Father 65   High Cholesterol Father    High blood pressure Father    Hypertension Sister    Hyperlipidemia Sister    Heart disease Sister        cabg age 50   Healthy Brother    Diabetes Maternal Grandmother    High blood pressure Maternal Grandmother    High Cholesterol Maternal Grandmother    Heart attack Maternal Grandfather    Heart disease Maternal Grandfather    Heart disease Paternal Grandfather    Testicular cancer Son    Colon cancer Neg Hx    Colon polyps Neg Hx    Esophageal cancer Neg Hx    Rectal cancer Neg Hx    Stomach cancer Neg Hx    Social History   Socioeconomic History   Marital status: Married    Spouse name: Sue Lush   Number of children: 2   Years of education: College   Highest education level: Bachelor's degree (e.g., BA, AB, BS)  Occupational History   Occupation: [employer]  Tobacco Use   Smoking status: Former    Types: Cigarettes    Quit date: 01/10/1998    Years since quitting: 24.4   Smokeless tobacco: Never  Vaping Use   Vaping Use: Never used  Substance and Sexual Activity   Alcohol use: No    Alcohol/week: 0.0 standard drinks of alcohol  Drug use: No   Sexual activity: Not on file  Other Topics Concern   Not on file  Social History Narrative   Lives with Harden Purgason (wife)   Caffeine use: soda/coffee daily   Social Determinants of Health   Financial Resource Strain: Low Risk  (02/14/2022)   Overall Financial Resource Strain (CARDIA)    Difficulty of Paying Living Expenses: Not hard at all  Food Insecurity: No Food Insecurity (02/14/2022)   Hunger Vital Sign    Worried About Running Out of Food in the Last Year: Never true    Ran Out of Food in the Last Year: Never true  Transportation Needs: No Transportation Needs (02/14/2022)   PRAPARE - Scientist, research (physical sciences) (Medical): No    Lack of Transportation (Non-Medical): No  Physical Activity: Insufficiently Active (02/14/2022)   Exercise Vital Sign    Days of Exercise per Week: 2 days    Minutes of Exercise per Session: 20 min  Stress: Stress Concern Present (02/14/2022)   [employer] of Occupational Health - Occupational Stress Questionnaire    Feeling of Stress : Very much  Social Connections: Unknown (02/14/2022)   Social Connection and Isolation Panel [NHANES]    Frequency of Communication with Friends and Family: More than three times a week    Frequency of Social Gatherings with Friends and Family: Three times a week    Attends Religious Services: Patient declined    Active Member of Clubs or Organizations: No    Attends Engineer, structural: Not on file    Marital Status: Married  Catering manager Violence: Not on file    Physical Exam: There were no vitals filed for this visit. There is no height or weight on file to calculate BMI. GEN: NAD EYE: Sclerae anicteric ENT: MMM CV: Non-tachycardic GI: Soft, NT/ND NEURO:  Alert & Oriented x 3  Lab Results: No results for input(s): "WBC", "HGB", "HCT", "PLT" in the last 72 hours. BMET No results for input(s): "NA", "K", "CL", "CO2", "GLUCOSE", "BUN", "CREATININE", "CALCIUM" in the last 72 hours. LFT No results for input(s): "PROT", "ALBUMIN", "AST", "ALT", "ALKPHOS", "BILITOT", "BILIDIR", "IBILI" in the last 72 hours. PT/INR No results for input(s): "LABPROT", "INR" in the last 72 hours.   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  The risks of an EUS including intestinal perforation, bleeding, infection, aspiration, and medication effects were discussed as was the possibility it may not give a definitive diagnosis if a biopsy is performed.  When a biopsy of the  pancreas is done as part of the EUS, there is an additional risk of pancreatitis at the rate of about 1-2%.  It was explained that procedure related pancreatitis is typically mild, although it can be severe and even life threatening, which is why we do not perform random pancreatic biopsies and only biopsy a lesion/area we feel is concerning enough to warrant the risk.   The risks and benefits of endoscopic evaluation/treatment were discussed with the patient and/or family; these include but are not limited to the risk of perforation, infection, bleeding, missed lesions, lack of diagnosis, severe illness requiring hospitalization, as well as anesthesia and sedation related illnesses.  The patient's history has been reviewed, patient examined, no change in status, and deemed stable for procedure.  The patient and/or family is agreeable to proceed.  Corliss Parish, MD [facility] Office # [phone]

## 2022-06-17 ENCOUNTER — Encounter: Payer: Self-pay | Admitting: Family Medicine

## 2022-06-17 ENCOUNTER — Encounter: Payer: Self-pay | Admitting: Gastroenterology

## 2022-06-17 LAB — SURGICAL PATHOLOGY

## 2022-06-20 LAB — CYTOLOGY - NON PAP

## 2022-06-21 ENCOUNTER — Encounter (HOSPITAL_COMMUNITY): Payer: Self-pay | Admitting: Gastroenterology

## 2022-06-21 ENCOUNTER — Telehealth: Payer: Self-pay

## 2022-06-21 DIAGNOSIS — H8110 Benign paroxysmal vertigo, unspecified ear: Secondary | ICD-10-CM | POA: Diagnosis not present

## 2022-06-21 DIAGNOSIS — K319 Disease of stomach and duodenum, unspecified: Secondary | ICD-10-CM

## 2022-06-21 NOTE — Telephone Encounter (Signed)
Please schedule CT scan for gastric nodule/subepithelial lesion.  Please schedule recall for 6 to 12 months EUS.

## 2022-06-21 NOTE — Telephone Encounter (Signed)
Recall has been entered and CT ordered and sent to the schedulers

## 2022-06-23 DIAGNOSIS — H8110 Benign paroxysmal vertigo, unspecified ear: Secondary | ICD-10-CM | POA: Diagnosis not present

## 2022-07-18 NOTE — Telephone Encounter (Signed)
Patient is calling states he is having questions regarding his upcoming CT scan. Pt is not sure what exactly it's for and is having some concerns regarding expenses. Please advise

## 2022-07-18 NOTE — Telephone Encounter (Signed)
Left message for pt to call back  °

## 2022-07-19 NOTE — Telephone Encounter (Signed)
Inbound call from patient returning phone call. Requesting a call back. Please advise, thank you.  

## 2022-07-19 NOTE — Telephone Encounter (Signed)
Patient also stated it is okay to communicate through mychart if he is unavailable by phone.

## 2022-07-19 NOTE — Telephone Encounter (Signed)
Please schedule CT scan for gastric nodule/subepithelial lesion.   This reason for CT scan was sent to pt via myhcart.

## 2022-07-21 NOTE — Telephone Encounter (Signed)
Patient scheduled CT scan for 08/19/22. EUS recall is in EPIC for 03/2023.

## 2022-08-10 ENCOUNTER — Encounter (INDEPENDENT_AMBULATORY_CARE_PROVIDER_SITE_OTHER): Payer: Self-pay

## 2022-08-16 ENCOUNTER — Other Ambulatory Visit: Payer: BC Managed Care – PPO

## 2022-08-16 ENCOUNTER — Encounter: Payer: Self-pay | Admitting: Family Medicine

## 2022-08-16 DIAGNOSIS — E1169 Type 2 diabetes mellitus with other specified complication: Secondary | ICD-10-CM

## 2022-08-16 NOTE — Telephone Encounter (Signed)
Ordered placed in future status for lipid panel and microalbumin

## 2022-08-19 ENCOUNTER — Ambulatory Visit (HOSPITAL_COMMUNITY)
Admission: RE | Admit: 2022-08-19 | Discharge: 2022-08-19 | Disposition: A | Discharge status: Home / Self Care | Payer: BC Managed Care – PPO | Source: Ambulatory Visit | Attending: Gastroenterology | Admitting: Gastroenterology

## 2022-08-19 DIAGNOSIS — K769 Liver disease, unspecified: Secondary | ICD-10-CM | POA: Diagnosis not present

## 2022-08-19 DIAGNOSIS — R932 Abnormal findings on diagnostic imaging of liver and biliary tract: Secondary | ICD-10-CM | POA: Diagnosis not present

## 2022-08-19 DIAGNOSIS — E1169 Type 2 diabetes mellitus with other specified complication: Secondary | ICD-10-CM | POA: Diagnosis not present

## 2022-08-19 DIAGNOSIS — Z794 Long term (current) use of insulin: Secondary | ICD-10-CM | POA: Insufficient documentation

## 2022-08-19 DIAGNOSIS — D1803 Hemangioma of intra-abdominal structures: Secondary | ICD-10-CM | POA: Diagnosis not present

## 2022-08-19 DIAGNOSIS — K319 Disease of stomach and duodenum, unspecified: Secondary | ICD-10-CM | POA: Insufficient documentation

## 2022-08-19 DIAGNOSIS — N281 Cyst of kidney, acquired: Secondary | ICD-10-CM | POA: Diagnosis not present

## 2022-08-19 LAB — POCT I-STAT CREATININE: Creatinine, Ser: 0.8 mg/dL (ref 0.61–1.24)

## 2022-08-19 MED ORDER — IOHEXOL 300 MG/ML  SOLN
100.0000 mL | Freq: Once | INTRAMUSCULAR | Status: AC | PRN
  Administered 2022-08-19: 100 mL via INTRAVENOUS

## 2022-08-23 ENCOUNTER — Encounter: Payer: Self-pay | Admitting: Family Medicine

## 2022-08-23 ENCOUNTER — Ambulatory Visit (INDEPENDENT_AMBULATORY_CARE_PROVIDER_SITE_OTHER): Payer: BC Managed Care – PPO | Admitting: Family Medicine

## 2022-08-23 ENCOUNTER — Other Ambulatory Visit (INDEPENDENT_AMBULATORY_CARE_PROVIDER_SITE_OTHER): Payer: BC Managed Care – PPO

## 2022-08-23 VITALS — BP 120/80 | HR 90 | Temp 98.4°F | Ht 67.0 in | Wt 198.3 lb

## 2022-08-23 DIAGNOSIS — F411 Generalized anxiety disorder: Secondary | ICD-10-CM

## 2022-08-23 DIAGNOSIS — E1169 Type 2 diabetes mellitus with other specified complication: Secondary | ICD-10-CM

## 2022-08-23 DIAGNOSIS — Z7985 Long-term (current) use of injectable non-insulin antidiabetic drugs: Secondary | ICD-10-CM | POA: Diagnosis not present

## 2022-08-23 LAB — POCT GLYCOSYLATED HEMOGLOBIN (HGB A1C): Hemoglobin A1C: 9.5 % — AB (ref 4.0–5.6)

## 2022-08-23 LAB — LIPID PANEL
Cholesterol: 157 mg/dL (ref 0–200)
HDL: 63.2 mg/dL (ref >39.00)
LDL Cholesterol: 83 mg/dL (ref 0–99)
NonHDL: 93.82
Total CHOL/HDL Ratio: 2
Triglycerides: 55 mg/dL (ref 0.0–149.0)
VLDL: 11 mg/dL (ref 0.0–40.0)

## 2022-08-23 LAB — MICROALBUMIN / CREATININE URINE RATIO
Creatinine,U: 111 mg/dL
Microalb Creat Ratio: 0.6 mg/g (ref 0.0–30.0)
Microalb, Ur: <0.7 mg/dL (ref 0.0–1.9)

## 2022-08-23 MED ORDER — LORAZEPAM 0.5 MG PO TABS
ORAL_TABLET | ORAL | 0 refills | Status: DC
Start: 2022-08-23 — End: 2022-12-27

## 2022-08-23 MED ORDER — TOUJEO MAX SOLOSTAR 300 UNIT/ML ~~LOC~~ SOPN
40.0000 [IU] | PEN_INJECTOR | Freq: Every evening | SUBCUTANEOUS | 1 refills | Status: DC
Start: 2022-08-23 — End: 2023-01-25

## 2022-08-23 MED ORDER — ESCITALOPRAM OXALATE 10 MG PO TABS
10.0000 mg | ORAL_TABLET | Freq: Every day | ORAL | 1 refills | Status: DC
Start: 2022-08-23 — End: 2023-01-25

## 2022-08-23 MED ORDER — TIRZEPATIDE 10 MG/0.5ML ~~LOC~~ SOAJ
10.0000 mg | SUBCUTANEOUS | 5 refills | Status: DC
Start: 2022-08-23 — End: 2022-10-27

## 2022-08-23 NOTE — Assessment & Plan Note (Signed)
Will increase lexapro to 10 mg daily. Pt will try this at home to see if this helps, we discussed the potential side effects and if he experiences any he will reduce back down to 5 mg daily. Refilled ativan PRN.

## 2022-08-23 NOTE — Progress Notes (Signed)
Established Patient Office Visit  Subjective   Patient ID: Jonathan Gross, male    DOB: 10-19-1965  Age: 57 y.o. MRN: [id number]  Chief Complaint  Patient presents with   Medical Management of Chronic Issues   Nail Problem    Patient complains of right thumb irritation from picking the nail x2 days    Patient is here for follow up today.   DM-- A1C is elevated today at 9.5, states that since he had an episode of the vertigo he has had more trouble getting back onto his diet and his exercise plan. States that he has had trouble recovering from this vertigo episode. Patient reports that he has not had any dizziness or vertigo since the last visit.   Depression/anxiety-- pt reports he is having more stress at home. States that he feels like it might be not working as well as when he first started the medication. Is only taking an ativan once in a while (30 tablets lasted about 6 months)     Current Outpatient Medications  Medication Instructions   aspirin 81 mg, Oral, Daily   atorvastatin (LIPITOR) 10 mg, Oral, Daily   BD PEN NEEDLE NANO 2ND GEN 32G X 4 MM MISC See admin instructions   Continuous Blood Gluc Sensor (FREESTYLE LIBRE 14 DAY SENSOR) MISC APPLY EVERY 14 DAYS   Empagliflozin-metFORMIN HCl ER (SYNJARDY XR) 12.05-998 MG TB24 TAKE 2 TABLETS BY MOUTH WITH BREAKFAST   escitalopram (LEXAPRO) 10 mg, Oral, Daily   lisinopril (ZESTRIL) 20 mg, Oral, Daily   LORazepam (ATIVAN) 0.5 MG tablet Take 1 tablet daily as needed for panic attack   Meclizine HCl 25 mg, Oral, 3 times daily PRN   ondansetron (ZOFRAN-ODT) 4 mg, Oral, Every 8 hours PRN   sildenafil (VIAGRA) 50 MG tablet TAKE ONE TABLET BY MOUTH DAILY AS NEEDED FOR ERECTILE DYSFUNCTION   tirzepatide (MOUNJARO) 10 mg, Subcutaneous, Weekly   Toujeo Max SoloStar 40 Units, Subcutaneous, Nightly    Patient Active Problem List   Diagnosis Date Noted   Vertigo 05/20/2022   Difficulty sleeping 04/04/2022   Pure hypercholesterolemia  04/04/2022   Anxiety state 03/22/2022   Long term current use of insulin (HCC) 03/22/2022   Type 2 diabetes mellitus with hyperglycemia (HCC) 11/18/2021   Hyperlipidemia associated with type 2 diabetes mellitus (HCC) 10/21/2019   Attention deficit disorder 01/16/2015   Generalized anxiety disorder 08/05/2008   PANIC DISORDER, HX OF 10/30/2006   Type 2 diabetes mellitus with other specified complication (HCC) 09/08/2006   Essential hypertension 09/08/2006      Review of Systems  All other systems reviewed and are negative.     Objective:     BP 120/80 (BP Location: Right Arm, Patient Position: Sitting, Cuff Size: Large)   Pulse 90   Temp 98.4 F (36.9 C) (Oral)   Ht 5\' 7"  (1.702 m)   Wt 198 lb 4.8 oz (89.9 kg)   SpO2 98%   BMI 31.06 kg/m    Physical Exam Vitals reviewed.  Constitutional:      Appearance: Normal appearance. He is well-groomed and normal weight.  Eyes:     Conjunctiva/sclera: Conjunctivae normal.  Cardiovascular:     Rate and Rhythm: Normal rate and regular rhythm.     Heart sounds: S1 normal and S2 normal. No murmur heard. Pulmonary:     Effort: Pulmonary effort is normal.     Breath sounds: Normal breath sounds and air entry. No rales.  Musculoskeletal:  Right lower leg: No edema.     Left lower leg: No edema.  Neurological:     General: No focal deficit present.     Mental Status: He is alert and oriented to person, place, and time.     Gait: Gait is intact.  Psychiatric:        Mood and Affect: Mood normal. Affect is flat.      Results for orders placed or performed in visit on 08/23/22  POC HgB A1c  Result Value Ref Range   Hemoglobin A1C 9.5 (A) 4.0 - 5.6 %   HbA1c POC (<> result, manual entry)     HbA1c, POC (prediabetic range)     HbA1c, POC (controlled diabetic range)        The ASCVD Risk score (Arnett DK, et al., 2019) failed to calculate for the following reasons:   The valid total cholesterol range is 130 to 320  mg/dL    Assessment & Plan:  Type 2 diabetes mellitus with other specified complication, without long-term current use of insulin (HCC) Assessment & Plan: A1C is uncontrolled, likely due to prolonged episode of vertigo and difficulty getting back on his diet/ exercise regimen. Pt is experiencing more stress at work. I will increase his mounjaro to 10 mg weekly this month and also his insulin to 40 units. He will call me in 1 month to let me know what his fasting sugars are so that we can increase the mounjaro to 12.5 mg if needed. RTC 3 months for A1C check.  Orders: -     POCT glycosylated hemoglobin (Hb A1C) -     Tirzepatide; Inject 10 mg into the skin once a week.  Dispense: 2 mL; Refill: 5 -     Toujeo Max SoloStar; Inject 40 Units into the skin at bedtime.  Dispense: 12 mL; Refill: 1  Anxiety state -     LORazepam; Take 1 tablet daily as needed for panic attack  Dispense: 30 tablet; Refill: 0  Generalized anxiety disorder Assessment & Plan: Will increase lexapro to 10 mg daily. Pt will try this at home to see if this helps, we discussed the potential side effects and if he experiences any he will reduce back down to 5 mg daily. Refilled ativan PRN.  Orders: -     Escitalopram Oxalate; Take 1 tablet (10 mg total) by mouth daily.  Dispense: 90 tablet; Refill: 1     Return in about 3 months (around 11/23/2022) for DM.    Karie Georges, MD

## 2022-08-23 NOTE — Assessment & Plan Note (Signed)
A1C is uncontrolled, likely due to prolonged episode of vertigo and difficulty getting back on his diet/ exercise regimen. Pt is experiencing more stress at work. I will increase his mounjaro to 10 mg weekly this month and also his insulin to 40 units. He will call me in 1 month to let me know what his fasting sugars are so that we can increase the mounjaro to 12.5 mg if needed. RTC 3 months for A1C check.

## 2022-08-23 NOTE — Patient Instructions (Addendum)
Increase the toujeo to 40 units at bedtime  Fasting blood sugar goal is between 100-120. You may increase the insulin by 5 units   Use hot compresses on the thumb to encourage drainage, then apply the neosporin to the area.  Message me in 1 month to let me know what your fasting sugars are--- we will likely need to increase the mounjaro to 12.5 mg weekly.

## 2022-09-16 ENCOUNTER — Other Ambulatory Visit: Payer: Self-pay | Admitting: Family Medicine

## 2022-09-16 DIAGNOSIS — I10 Essential (primary) hypertension: Secondary | ICD-10-CM

## 2022-09-19 ENCOUNTER — Other Ambulatory Visit: Payer: Self-pay | Admitting: Family Medicine

## 2022-09-19 DIAGNOSIS — R42 Dizziness and giddiness: Secondary | ICD-10-CM

## 2022-09-24 ENCOUNTER — Encounter: Payer: Self-pay | Admitting: Family Medicine

## 2022-10-11 ENCOUNTER — Other Ambulatory Visit: Payer: Self-pay | Admitting: Family Medicine

## 2022-10-27 ENCOUNTER — Other Ambulatory Visit: Payer: Self-pay | Admitting: *Deleted

## 2022-10-27 DIAGNOSIS — E1169 Type 2 diabetes mellitus with other specified complication: Secondary | ICD-10-CM

## 2022-10-27 MED ORDER — TIRZEPATIDE 10 MG/0.5ML ~~LOC~~ SOAJ: 10.0000 mg | SUBCUTANEOUS | 5 refills | Status: DC

## 2022-11-19 ENCOUNTER — Other Ambulatory Visit: Payer: Self-pay | Admitting: Family Medicine

## 2022-11-23 ENCOUNTER — Ambulatory Visit: Payer: BC Managed Care – PPO | Admitting: Family Medicine

## 2022-12-26 ENCOUNTER — Other Ambulatory Visit: Payer: Self-pay | Admitting: Family Medicine

## 2022-12-26 DIAGNOSIS — F411 Generalized anxiety disorder: Secondary | ICD-10-CM

## 2023-01-25 ENCOUNTER — Encounter: Payer: Self-pay | Admitting: Family Medicine

## 2023-01-25 ENCOUNTER — Ambulatory Visit: Payer: BC Managed Care – PPO | Admitting: Family Medicine

## 2023-01-25 VITALS — BP 112/80 | HR 91 | Temp 98.4°F | Ht 67.0 in | Wt 199.1 lb

## 2023-01-25 DIAGNOSIS — Z794 Long term (current) use of insulin: Secondary | ICD-10-CM

## 2023-01-25 DIAGNOSIS — F411 Generalized anxiety disorder: Secondary | ICD-10-CM | POA: Diagnosis not present

## 2023-01-25 DIAGNOSIS — E1169 Type 2 diabetes mellitus with other specified complication: Secondary | ICD-10-CM | POA: Diagnosis not present

## 2023-01-25 DIAGNOSIS — L739 Follicular disorder, unspecified: Secondary | ICD-10-CM

## 2023-01-25 DIAGNOSIS — Z7985 Long-term (current) use of injectable non-insulin antidiabetic drugs: Secondary | ICD-10-CM

## 2023-01-25 LAB — POCT GLYCOSYLATED HEMOGLOBIN (HGB A1C): Hemoglobin A1C: 9.7 % — AB (ref 4.0–5.6)

## 2023-01-25 MED ORDER — ESCITALOPRAM OXALATE 10 MG PO TABS: 10.0000 mg | ORAL_TABLET | Freq: Every day | ORAL | 1 refills | Status: DC

## 2023-01-25 MED ORDER — MUPIROCIN 2 % EX OINT: 1.0000 | TOPICAL_OINTMENT | Freq: Two times a day (BID) | CUTANEOUS | 1 refills | Status: AC

## 2023-01-25 MED ORDER — MOUNJARO 12.5 MG/0.5ML ~~LOC~~ SOAJ: 12.5000 mg | SUBCUTANEOUS | 3 refills | Status: DC

## 2023-01-25 MED ORDER — FREESTYLE LIBRE 14 DAY SENSOR MISC: 5 refills | Status: DC

## 2023-01-25 MED ORDER — TOUJEO MAX SOLOSTAR 300 UNIT/ML ~~LOC~~ SOPN: 40.0000 [IU] | PEN_INJECTOR | Freq: Every evening | SUBCUTANEOUS | 1 refills | Status: DC

## 2023-01-25 NOTE — Assessment & Plan Note (Signed)
 Chronic, insulin  dependent, uncontrolled. Pt was nonadherent to his diet and exercise plan over the holidays. I recommended increasing his mounjaro  to 12.5 mg weekly and he is agreeable. I have refilled his freestyle libre sensors for monitoring. RTC in 3 months for repeat A1C. It is likely that he will need another increase to 15 mg and we will need to add either glipizide or actos  to his regimen after the next visit.

## 2023-01-25 NOTE — Progress Notes (Signed)
 Established Patient Office Visit  Subjective   Patient ID: Jonathan Gross, male    DOB: [birth year]  Age: 58 y.o. MRN: [id number]  Chief Complaint  Patient presents with   Medical Management of Chronic Issues    Pt is here for follow up on uncontrolled diabetes-- he reports he was not able to adhere to his diet and exercise over the holidays. A1C is 9.7, he reports that the mounjaro  isn't really reducing his appetite like he has seen on tv. He reports compliance with his medications, we discussed increasing his mounjaro  to 12.5 mg and he is agreeable.   Pt is complaining of itchy bumps on his scalp that have started in the past couple of months. He is using his normal shampoo, but the scalp has become flaky, red and itchy. States he can feel bumps on his scalp that are slightly tender.   Anxiety-- pt reports good control on the lexapro , no side effects reported. He is also using his lorazepam  occasionally, PDMP reviewed.     Current Outpatient Medications  Medication Instructions   aspirin 81 mg, Daily   atorvastatin  (LIPITOR) 10 mg, Oral, Daily   BD PEN NEEDLE NANO 2ND GEN 32G X 4 MM MISC See admin instructions   Continuous Glucose Sensor (FREESTYLE LIBRE 14 DAY SENSOR) MISC APPLY EVERY 14 DAYS   Empagliflozin-metFORMIN HCl ER (SYNJARDY  XR) 12.05-998 MG TB24 TAKE 2 TABLETS BY MOUTH WITH BREAKFAST   escitalopram  (LEXAPRO ) 10 mg, Oral, Daily   lisinopril  (ZESTRIL ) 20 mg, Oral, Daily   LORazepam  (ATIVAN ) 0.5 MG tablet TAKE 1 TABLET DAILY AS NEEDED FOR PANIC ATTACK   meclizine  (ANTIVERT ) 25 MG tablet CHEW AND SWALLOW 1 TABLET BY MOUTH 3 TIMES A DAY AS NEEDED   [START ON 02/13/2023] Mounjaro  12.5 mg, Subcutaneous, Weekly, Ok to increase dose by 2.5 mg weekly after weeks on this dose.   mupirocin  ointment (BACTROBAN ) 2 % 1 Application, Topical, 2 times daily, Apply for 7-14 days.   ondansetron  (ZOFRAN -ODT) 4 mg, Oral, Every 8 hours PRN   sildenafil  (VIAGRA ) 50 MG tablet TAKE ONE TABLET BY  MOUTH DAILY AS NEEDED FOR ERECTILE DYSFUNCTION   tirzepatide  (MOUNJARO ) 10 mg, Subcutaneous, Weekly   Toujeo  Max SoloStar 40 Units, Subcutaneous, Nightly    Patient Active Problem List   Diagnosis Date Noted   Vertigo 05/20/2022   Difficulty sleeping 04/04/2022   Pure hypercholesterolemia 04/04/2022   Anxiety state 03/22/2022   Long term current use of insulin  (HCC) 03/22/2022   Type 2 diabetes mellitus with hyperglycemia (HCC) 11/18/2021   Hyperlipidemia associated with type 2 diabetes mellitus (HCC) 10/21/2019   Attention deficit disorder 01/16/2015   Generalized anxiety disorder 08/05/2008   PANIC DISORDER, HX OF 10/30/2006   Type 2 diabetes mellitus with other specified complication (HCC) 09/08/2006   Essential hypertension 09/08/2006      Review of Systems  All other systems reviewed and are negative.     Objective:     BP 112/80   Pulse 91   Temp 98.4 F (36.9 C) (Oral)   Ht 5\' 7"  (1.702 m)   Wt 199 lb 1.6 oz (90.3 kg)   SpO2 98%   BMI 31.18 kg/m    Physical Exam Vitals reviewed.  Constitutional:      Appearance: Normal appearance. He is obese.  Cardiovascular:     Pulses: Normal pulses.  Pulmonary:     Effort: Pulmonary effort is normal.  Skin:    Comments: Inflammation of the follicles of the  scalp with nodular red bumps, some are pustular. Dstributed mostly near the frontal and temporal hairlines.   Neurological:     Mental Status: He is alert and oriented to person, place, and time.  Psychiatric:        Mood and Affect: Mood normal.        Behavior: Behavior normal.    Diabetic Foot Exam - Simple   Simple Foot Form Diabetic Foot exam was performed with the following findings: Yes 01/25/2023  8:59 AM  Visual Inspection No deformities, no ulcerations, no other skin breakdown bilaterally: Yes Sensation Testing Intact to touch and monofilament testing bilaterally: Yes Pulse Check Posterior Tibialis and Dorsalis pulse intact bilaterally:  Yes Comments Missing first and second toes on the right foot, well healed, chronic.        Results for orders placed or performed in visit on 01/25/23  POC HgB A1c  Result Value Ref Range   Hemoglobin A1C 9.7 (A) 4.0 - 5.6 %   HbA1c POC (<> result, manual entry)     HbA1c, POC (prediabetic range)     HbA1c, POC (controlled diabetic range)        The 10-year ASCVD risk score (Arnett DK, et al., 2019) is: 8.3%    Assessment & Plan:  Type 2 diabetes mellitus with other specified complication, without long-term current use of insulin  (HCC) Assessment & Plan: Chronic, insulin  dependent, uncontrolled. Pt was nonadherent to his diet and exercise plan over the holidays. I recommended increasing his mounjaro  to 12.5 mg weekly and he is agreeable. I have refilled his freestyle libre sensors for monitoring. RTC in 3 months for repeat A1C. It is likely that he will need another increase to 15 mg and we will need to add either glipizide or actos  to his regimen after the next visit.   Orders: -     POCT glycosylated hemoglobin (Hb A1C) -     Mounjaro ; Inject 12.5 mg into the skin once a week. Ok to increase dose by 2.5 mg weekly after weeks on this dose.  Dispense: 2 mL; Refill: 3 -     Toujeo  Max SoloStar; Inject 40 Units into the skin at bedtime.  Dispense: 12 mL; Refill: 1 -     FreeStyle Libre 14 Day Sensor; APPLY EVERY 14 DAYS  Dispense: 6 each; Refill: 5  Generalized anxiety disorder Assessment & Plan: Chronic, stable symptoms, continue lexapro  to 10 mg daily. Continue PRN lorazepam  sparingly. PDMP reviewed this visit.   Orders: -     Escitalopram  Oxalate; Take 1 tablet (10 mg total) by mouth daily.  Dispense: 90 tablet; Refill: 1  Folliculitis -     Mupirocin ; Apply 1 Application topically 2 (two) times daily. Apply for 7-14 days.  Dispense: 22 g; Refill: 1   Acute problem, will switch his shampoo to selenium sulfide and rx antibacterial ointment BID application.   Return in  about 3 months (around 04/25/2023) for DM -- come fasting for labs .    Aida House, MD

## 2023-01-25 NOTE — Assessment & Plan Note (Signed)
 Chronic, stable symptoms, continue lexapro  to 10 mg daily. Continue PRN lorazepam  sparingly. PDMP reviewed this visit.

## 2023-01-25 NOTE — Patient Instructions (Signed)
 Selsun Blue-- selenium sulfide shampoo

## 2023-02-03 ENCOUNTER — Other Ambulatory Visit: Payer: Self-pay | Admitting: Family Medicine

## 2023-02-03 DIAGNOSIS — E1169 Type 2 diabetes mellitus with other specified complication: Secondary | ICD-10-CM

## 2023-02-03 MED ORDER — MOUNJARO 12.5 MG/0.5ML ~~LOC~~ SOAJ
12.5000 mg | SUBCUTANEOUS | 3 refills | Status: DC
Start: 2023-02-13 — End: 2023-05-09

## 2023-03-17 ENCOUNTER — Other Ambulatory Visit: Payer: Self-pay | Admitting: Family Medicine

## 2023-03-17 DIAGNOSIS — I10 Essential (primary) hypertension: Secondary | ICD-10-CM

## 2023-03-20 ENCOUNTER — Other Ambulatory Visit (HOSPITAL_COMMUNITY): Payer: Self-pay

## 2023-03-23 DIAGNOSIS — E119 Type 2 diabetes mellitus without complications: Secondary | ICD-10-CM | POA: Diagnosis not present

## 2023-03-23 DIAGNOSIS — H53143 Visual discomfort, bilateral: Secondary | ICD-10-CM | POA: Diagnosis not present

## 2023-03-23 LAB — HM DIABETES EYE EXAM

## 2023-04-02 ENCOUNTER — Other Ambulatory Visit: Payer: Self-pay | Admitting: Family Medicine

## 2023-04-25 ENCOUNTER — Encounter: Payer: Self-pay | Admitting: Family Medicine

## 2023-05-03 ENCOUNTER — Other Ambulatory Visit: Payer: Self-pay | Admitting: Family Medicine

## 2023-05-03 DIAGNOSIS — F411 Generalized anxiety disorder: Secondary | ICD-10-CM

## 2023-05-07 ENCOUNTER — Other Ambulatory Visit: Payer: Self-pay | Admitting: Family Medicine

## 2023-05-07 DIAGNOSIS — E1169 Type 2 diabetes mellitus with other specified complication: Secondary | ICD-10-CM

## 2023-05-09 ENCOUNTER — Telehealth: Payer: Self-pay

## 2023-05-09 ENCOUNTER — Ambulatory Visit: Payer: BC Managed Care – PPO | Admitting: Family Medicine

## 2023-05-09 ENCOUNTER — Encounter: Payer: Self-pay | Admitting: Family Medicine

## 2023-05-09 ENCOUNTER — Other Ambulatory Visit (HOSPITAL_COMMUNITY): Payer: Self-pay

## 2023-05-09 VITALS — BP 136/88 | HR 95 | Temp 98.3°F | Ht 67.0 in | Wt 196.2 lb

## 2023-05-09 DIAGNOSIS — Z7985 Long-term (current) use of injectable non-insulin antidiabetic drugs: Secondary | ICD-10-CM | POA: Diagnosis not present

## 2023-05-09 DIAGNOSIS — Z7984 Long term (current) use of oral hypoglycemic drugs: Secondary | ICD-10-CM

## 2023-05-09 DIAGNOSIS — E1169 Type 2 diabetes mellitus with other specified complication: Secondary | ICD-10-CM

## 2023-05-09 DIAGNOSIS — F411 Generalized anxiety disorder: Secondary | ICD-10-CM

## 2023-05-09 LAB — MICROALBUMIN / CREATININE URINE RATIO
Creatinine,U: 85.7 mg/dL
Microalb Creat Ratio: UNDETERMINED mg/g (ref 0.0–30.0)
Microalb, Ur: <0.7 mg/dL

## 2023-05-09 LAB — COMPREHENSIVE METABOLIC PANEL WITH GFR
ALT: 15 U/L (ref 0–53)
AST: 15 U/L (ref 0–37)
Albumin: 4 g/dL (ref 3.5–5.2)
Alkaline Phosphatase: 48 U/L (ref 39–117)
BUN: 13 mg/dL (ref 6–23)
CO2: 27 meq/L (ref 19–32)
Calcium: 8.4 mg/dL (ref 8.4–10.5)
Chloride: 107 meq/L (ref 96–112)
Creatinine, Ser: 0.77 mg/dL (ref 0.40–1.50)
GFR: 98.43 mL/min (ref >60.00)
Glucose, Bld: 144 mg/dL — ABNORMAL HIGH (ref 70–99)
Potassium: 3.8 meq/L (ref 3.5–5.1)
Sodium: 141 meq/L (ref 135–145)
Total Bilirubin: 0.7 mg/dL (ref 0.2–1.2)
Total Protein: 5.9 g/dL — ABNORMAL LOW (ref 6.0–8.3)

## 2023-05-09 LAB — POCT GLYCOSYLATED HEMOGLOBIN (HGB A1C): Hemoglobin A1C: 9.1 % — AB (ref 4.0–5.6)

## 2023-05-09 LAB — LIPID PANEL
Cholesterol: 122 mg/dL (ref 0–200)
HDL: 60.2 mg/dL (ref >39.00)
LDL Cholesterol: 53 mg/dL (ref 0–99)
NonHDL: 62.02
Total CHOL/HDL Ratio: 2
Triglycerides: 44 mg/dL (ref 0.0–149.0)
VLDL: 8.8 mg/dL (ref 0.0–40.0)

## 2023-05-09 MED ORDER — ATORVASTATIN CALCIUM 10 MG PO TABS: 10.0000 mg | ORAL_TABLET | Freq: Every day | ORAL | 1 refills | Status: DC

## 2023-05-09 MED ORDER — ESCITALOPRAM OXALATE 10 MG PO TABS: 15.0000 mg | ORAL_TABLET | Freq: Every day | ORAL | 1 refills | Status: DC

## 2023-05-09 MED ORDER — PIOGLITAZONE HCL 15 MG PO TABS: 15.0000 mg | ORAL_TABLET | Freq: Every day | ORAL | 1 refills | Status: DC

## 2023-05-09 MED ORDER — MOUNJARO 15 MG/0.5ML ~~LOC~~ SOAJ: 15.0000 mg | SUBCUTANEOUS | 5 refills | Status: DC

## 2023-05-09 NOTE — Telephone Encounter (Signed)
 Pharmacy Patient Advocate Encounter   Received notification from CoverMyMeds that prior authorization for Pioglitazone HCl 15MG  tablets is required/requested.   Insurance verification completed.   The patient is insured through [employer] .   Per test claim: PA required; PA submitted to above mentioned insurance via CoverMyMeds Key/confirmation #/EOC [id number] Status is pending

## 2023-05-09 NOTE — Assessment & Plan Note (Signed)
 A1C is better but still uncontrolled, will increase mounjaro  to 15 mg weekly and add pioglitazone 15 mg daily. Will see him back in 4 months for repeat A1C. Continue synardy 12.05/998 BID and 32 units Toujeo  daily.

## 2023-05-09 NOTE — Assessment & Plan Note (Signed)
 Pt is feeling better on 15 mg daily of lexapro , will continue this medication at the 15 mg dose. Continue PRN lorazepam 

## 2023-05-09 NOTE — Telephone Encounter (Signed)
 Pharmacy Patient Advocate Encounter  Received notification from [facility] that Prior Authorization for Pioglitazone HCl 15MG  tablets has been APPROVED from 05/09/23 to 05/08/24. Ran test claim, Copay is $20. This test claim was processed through [facility]- copay amounts may vary at other pharmacies due to pharmacy/plan contracts, or as the patient moves through the different stages of their insurance plan.   PA #/Case ID/Reference #: [id number]

## 2023-05-09 NOTE — Patient Instructions (Addendum)
 Heating pad  Ibuprofen 800 mg every 8 hours-- take with food as needed for shoulder pain.   Vitamin D  -- at least 800 units daily   Vitamin B complex vitamins help with energy  -- make sure it says 100%  of the daily recommended value

## 2023-05-09 NOTE — Progress Notes (Signed)
 Established Patient Office Visit  Subjective   Patient ID: Jonathan Gross, male    DOB: [birth year]  Age: 58 y.o. MRN: [id number]  Chief Complaint  Patient presents with   Medical Management of Chronic Issues    Pt is here for follow up on his anxiety and diabetes.  Anxiety-- pt states he went up to 15 mg on the lexapro  daily due to additional stress and anxiety at work. States that he is feeling much better on the higher dose, denies any side effects to the medication. Would like to continue the 15 mg daily.  Diabetes-- A1C slightly improved to 9.1 today, pt states he works a lot and has a lot of stress and doesn't always watch his diet. States that he reduced his toujeo  to 32 units at night due to having "low sugars" overnight.     Current Outpatient Medications  Medication Instructions   aspirin 81 mg, Daily   atorvastatin  (LIPITOR) 10 mg, Oral, Daily   BD PEN NEEDLE NANO 2ND GEN 32G X 4 MM MISC See admin instructions   Continuous Glucose Sensor (FREESTYLE LIBRE 14 DAY SENSOR) MISC APPLY EVERY 14 DAYS   Empagliflozin-metFORMIN HCl ER (SYNJARDY  XR) 12.05-998 MG TB24 TAKE 2 TABLETS BY MOUTH WITH BREAKFAST   escitalopram  (LEXAPRO ) 15 mg, Oral, Daily   lisinopril  (ZESTRIL ) 20 mg, Oral, Daily   LORazepam  (ATIVAN ) 0.5 MG tablet TAKE 1 TABLET DAILY AS NEEDED FOR PANIC ATTACK   meclizine  (ANTIVERT ) 25 MG tablet CHEW AND SWALLOW 1 TABLET BY MOUTH 3 TIMES A DAY AS NEEDED   Mounjaro  15 mg, Subcutaneous, Weekly   mupirocin  ointment (BACTROBAN ) 2 % 1 Application, Topical, 2 times daily, Apply for 7-14 days.   ondansetron  (ZOFRAN -ODT) 4 mg, Oral, Every 8 hours PRN   pioglitazone (ACTOS) 15 mg, Oral, Daily   sildenafil  (VIAGRA ) 50 MG tablet TAKE ONE TABLET BY MOUTH DAILY AS NEEDED FOR ERECTILE DYSFUNCTION   Toujeo  Max SoloStar 40 Units, Subcutaneous, Nightly    Patient Active Problem List   Diagnosis Date Noted   Vertigo 05/20/2022   Difficulty sleeping 04/04/2022   Pure  hypercholesterolemia 04/04/2022   Anxiety state 03/22/2022   Long term current use of insulin  (HCC) 03/22/2022   Type 2 diabetes mellitus with hyperglycemia (HCC) 11/18/2021   Hyperlipidemia associated with type 2 diabetes mellitus (HCC) 10/21/2019   Attention deficit disorder 01/16/2015   Generalized anxiety disorder 08/05/2008   PANIC DISORDER, HX OF 10/30/2006   Type 2 diabetes mellitus with other specified complication (HCC) 09/08/2006   Essential hypertension 09/08/2006      Review of Systems  All other systems reviewed and are negative.     Objective:     BP 136/88   Pulse 95   Temp 98.3 F (36.8 C) (Oral)   Ht 5\' 7"  (1.702 m)   Wt 196 lb 3.2 oz (89 kg)   SpO2 97%   BMI 30.73 kg/m    Physical Exam Vitals reviewed.  Constitutional:      Appearance: Normal appearance. He is well-groomed and normal weight.  Eyes:     Extraocular Movements: Extraocular movements intact.     Conjunctiva/sclera: Conjunctivae normal.  Neck:     Thyroid : No thyromegaly.  Cardiovascular:     Rate and Rhythm: Normal rate and regular rhythm.     Heart sounds: S1 normal and S2 normal. No murmur heard. Pulmonary:     Effort: Pulmonary effort is normal.     Breath sounds: Normal breath sounds and  air entry. No rales.  Abdominal:     General: Abdomen is flat. Bowel sounds are normal.  Musculoskeletal:     Right lower leg: No edema.     Left lower leg: No edema.  Neurological:     General: No focal deficit present.     Mental Status: He is alert and oriented to person, place, and time.     Gait: Gait is intact.  Psychiatric:        Mood and Affect: Mood and affect normal.      The ASCVD Risk score (Arnett DK, et al., 2019) failed to calculate for the following reasons:   The valid total cholesterol range is 130 to 320 mg/dL    Assessment & Plan:  Type 2 diabetes mellitus with other specified complication, without long-term current use of insulin  (HCC) Assessment & Plan: A1C  is better but still uncontrolled, will increase mounjaro  to 15 mg weekly and add pioglitazone 15 mg daily. Will see him back in 4 months for repeat A1C. Continue synardy 12.05/998 BID and 32 units Toujeo  daily.  Orders: -     POCT glycosylated hemoglobin (Hb A1C) -     Collection capillary blood specimen -     Atorvastatin  Calcium ; Take 1 tablet (10 mg total) by mouth daily.  Dispense: 90 tablet; Refill: 1 -     Mounjaro ; Inject 15 mg into the skin once a week.  Dispense: 2 mL; Refill: 5 -     Pioglitazone HCl; Take 1 tablet (15 mg total) by mouth daily.  Dispense: 90 tablet; Refill: 1 -     Comprehensive metabolic panel with GFR; Future -     Lipid panel; Future -     Microalbumin / creatinine urine ratio; Future  Generalized anxiety disorder Assessment & Plan: Pt is feeling better on 15 mg daily of lexapro , will continue this medication at the 15 mg dose. Continue PRN lorazepam    Orders: -     Escitalopram  Oxalate; Take 1.5 tablets (15 mg total) by mouth daily.  Dispense: 135 tablet; Refill: 1     Return in about 4 months (around 09/08/2023).    Aida House, MD

## 2023-05-11 ENCOUNTER — Encounter: Payer: Self-pay | Admitting: Family Medicine

## 2023-09-03 ENCOUNTER — Other Ambulatory Visit: Payer: Self-pay | Admitting: Family Medicine

## 2023-09-12 ENCOUNTER — Ambulatory Visit: Admitting: Family Medicine

## 2023-09-13 ENCOUNTER — Encounter: Payer: Self-pay | Admitting: Family Medicine

## 2023-09-13 ENCOUNTER — Ambulatory Visit: Admitting: Family Medicine

## 2023-09-13 VITALS — BP 130/88 | HR 77 | Temp 97.6°F | Ht 67.0 in | Wt 202.8 lb

## 2023-09-13 DIAGNOSIS — Z794 Long term (current) use of insulin: Secondary | ICD-10-CM | POA: Diagnosis not present

## 2023-09-13 DIAGNOSIS — F411 Generalized anxiety disorder: Secondary | ICD-10-CM

## 2023-09-13 DIAGNOSIS — Z7984 Long term (current) use of oral hypoglycemic drugs: Secondary | ICD-10-CM

## 2023-09-13 DIAGNOSIS — E1169 Type 2 diabetes mellitus with other specified complication: Secondary | ICD-10-CM

## 2023-09-13 DIAGNOSIS — Z7985 Long-term (current) use of injectable non-insulin antidiabetic drugs: Secondary | ICD-10-CM

## 2023-09-13 LAB — POCT GLYCOSYLATED HEMOGLOBIN (HGB A1C): Hemoglobin A1C: 8.6 % — AB (ref 4.0–5.6)

## 2023-09-13 MED ORDER — MOUNJARO 15 MG/0.5ML ~~LOC~~ SOAJ: 15.0000 mg | SUBCUTANEOUS | 5 refills | Status: DC

## 2023-09-13 MED ORDER — ARIPIPRAZOLE 5 MG PO TABS: 5.0000 mg | ORAL_TABLET | Freq: Every day | ORAL | 2 refills | Status: DC

## 2023-09-13 NOTE — Progress Notes (Signed)
 Established Patient Office Visit  Subjective   Patient ID: Jonathan Gross, male    DOB: 1965/06/01  Age: 58 y.o. MRN: [id number]  Chief Complaint  Patient presents with   Medical Management of Chronic Issues    Pt is here for follow up today on his DM. He reports 2-3 week history of increasing anxiety, tenseness, gritting his teeth. States that he is so tense it is making his body hurt. States that his work is getting more challenging, he is feeling pushed at work, to the point that he had a couple of outbursts at work. States that he even had trouble relaxing on his vacation. States he has been trying mushroom blend coffee with his regular coffee for the last few weeks or so, states it seemed to be helping with his energy level but it correlates with the increase in intensity.   DM -- A1C today is 8.6 which is improving, he reports compliance with his medications, no side effects reported. I reviewed his medications as previous labs, he is UTD on the foot and eye exams.     Current Outpatient Medications  Medication Instructions   ARIPiprazole  (ABILIFY ) 5 mg, Oral, Daily   aspirin 81 mg, Daily   atorvastatin  (LIPITOR) 10 mg, Oral, Daily   BD PEN NEEDLE NANO 2ND GEN 32G X 4 MM MISC See admin instructions   Continuous Glucose Sensor (FREESTYLE LIBRE 14 DAY SENSOR) MISC APPLY EVERY 14 DAYS   escitalopram  (LEXAPRO ) 15 mg, Oral, Daily   lisinopril  (ZESTRIL ) 20 mg, Oral, Daily   LORazepam  (ATIVAN ) 0.5 MG tablet TAKE 1 TABLET DAILY AS NEEDED FOR PANIC ATTACK   meclizine  (ANTIVERT ) 25 MG tablet CHEW AND SWALLOW 1 TABLET BY MOUTH 3 TIMES A DAY AS NEEDED   Mounjaro  15 mg, Subcutaneous, Weekly   mupirocin  ointment (BACTROBAN ) 2 % 1 Application, Topical, 2 times daily, Apply for 7-14 days.   ondansetron  (ZOFRAN -ODT) 4 mg, Oral, Every 8 hours PRN   pioglitazone  (ACTOS ) 15 mg, Oral, Daily   sildenafil  (VIAGRA ) 50 MG tablet TAKE ONE TABLET BY MOUTH DAILY AS NEEDED FOR ERECTILE DYSFUNCTION    SYNJARDY  XR 12.05-998 MG TB24 TAKE 2 TABLETS BY MOUTH WITH BREAKFAST   Toujeo  Max SoloStar 40 Units, Subcutaneous, Nightly    Patient Active Problem List   Diagnosis Date Noted   Vertigo 05/20/2022   Difficulty sleeping 04/04/2022   Pure hypercholesterolemia 04/04/2022   Anxiety state 03/22/2022   Long term current use of insulin  (HCC) 03/22/2022   Type 2 diabetes mellitus with hyperglycemia (HCC) 11/18/2021   Hyperlipidemia associated with type 2 diabetes mellitus (HCC) 10/21/2019   Attention deficit disorder 01/16/2015   Generalized anxiety disorder 08/05/2008   PANIC DISORDER, HX OF 10/30/2006   Type 2 diabetes mellitus with other specified complication (HCC) 09/08/2006   Essential hypertension 09/08/2006      Review of Systems  Constitutional:  Negative for chills and fever.  Psychiatric/Behavioral:  The patient is nervous/anxious.   All other systems reviewed and are negative.     Objective:     BP 130/88   Pulse 77   Temp 97.6 F (36.4 C) (Oral)   Ht 5' 7 (1.702 m)   Wt 202 lb 12.8 oz (92 kg)   SpO2 98%   BMI 31.76 kg/m    Physical Exam Vitals reviewed.  Constitutional:      Appearance: Normal appearance. He is well-groomed and normal weight.  Eyes:     Conjunctiva/sclera: Conjunctivae normal.  Cardiovascular:  Rate and Rhythm: Normal rate and regular rhythm.     Heart sounds: S1 normal and S2 normal. No murmur heard. Pulmonary:     Effort: Pulmonary effort is normal.     Breath sounds: Normal breath sounds and air entry. No rales.  Musculoskeletal:     Right lower leg: No edema.     Left lower leg: No edema.  Neurological:     General: No focal deficit present.     Mental Status: He is alert and oriented to person, place, and time.     Gait: Gait is intact.  Psychiatric:        Mood and Affect: Mood normal. Affect is flat.      Results for orders placed or performed in visit on 09/13/23  POC HgB A1c  Result Value Ref Range   Hemoglobin  A1C 8.6 (A) 4.0 - 5.6 %   HbA1c POC (<> result, manual entry)     HbA1c, POC (prediabetic range)     HbA1c, POC (controlled diabetic range)        The ASCVD Risk score (Arnett DK, et al., 2019) failed to calculate for the following reasons:   The valid total cholesterol range is 130 to 320 mg/dL    Assessment & Plan:  Type 2 diabetes mellitus with other specified complication, without long-term current use of insulin  (HCC) Assessment & Plan: A1C is better but still uncontrolled, we discussed his medications, pt wants to wait before increasing his medications at this time.   Will see him back in 4 months for repeat A1C. Continue synardy 12.05/998 BID and 40 units Toujeo  daily.  Orders: -     POCT glycosylated hemoglobin (Hb A1C) -     Collection capillary blood specimen -     Mounjaro ; Inject 15 mg into the skin once a week.  Dispense: 2 mL; Refill: 5  Anxiety state -     ARIPiprazole ; Take 1 tablet (5 mg total) by mouth daily.  Dispense: 30 tablet; Refill: 2  Generalized anxiety disorder Assessment & Plan: Pt is reporting increasing anger and outbursts at work. We had a long discussion about his anxiety symptoms and the possibility of autism spectrum disorder. Pt exhibits many characteristics of autism in visits with me and I counseled him at length on what this diagnosis is and how it is confirmed. We discussed referral to a psychologist for testing, pt wants to research it first then get back to me. We discussed increasing his lexapro  vs starting a mood stabilizer medication in order to help with calming. I recommended starting abilify  and reviewed the risks/benefits of the medication. I will see him back short term in 3 months to re-evaluate his symptoms.       Return in about 3 months (around 12/13/2023) for DM.    Heron CHRISTELLA Sharper, MD

## 2023-09-15 ENCOUNTER — Encounter: Payer: Self-pay | Admitting: Family Medicine

## 2023-09-18 NOTE — Assessment & Plan Note (Signed)
 Pt is reporting increasing anger and outbursts at work. We had a long discussion about his anxiety symptoms and the possibility of autism spectrum disorder. Pt exhibits many characteristics of autism in visits with me and I counseled him at length on what this diagnosis is and how it is confirmed. We discussed referral to a psychologist for testing, pt wants to research it first then get back to me. We discussed increasing his lexapro  vs starting a mood stabilizer medication in order to help with calming. I recommended starting abilify  and reviewed the risks/benefits of the medication. I will see him back short term in 3 months to re-evaluate his symptoms.

## 2023-09-18 NOTE — Addendum Note (Signed)
 Addended by: OZELL HERON HERO on: 09/18/2023 09:07 PM   Modules accepted: Orders

## 2023-09-18 NOTE — Assessment & Plan Note (Signed)
 A1C is better but still uncontrolled, we discussed his medications, pt wants to wait before increasing his medications at this time.   Will see him back in 4 months for repeat A1C. Continue synardy 12.05/998 BID and 40 units Toujeo  daily.

## 2023-10-02 ENCOUNTER — Encounter: Payer: Self-pay | Admitting: Family Medicine

## 2023-10-02 ENCOUNTER — Ambulatory Visit (INDEPENDENT_AMBULATORY_CARE_PROVIDER_SITE_OTHER): Admitting: Clinical

## 2023-10-02 DIAGNOSIS — E1169 Type 2 diabetes mellitus with other specified complication: Secondary | ICD-10-CM

## 2023-10-02 DIAGNOSIS — F411 Generalized anxiety disorder: Secondary | ICD-10-CM

## 2023-10-02 MED ORDER — PIOGLITAZONE HCL 15 MG PO TABS
15.0000 mg | ORAL_TABLET | Freq: Every day | ORAL | 1 refills | Status: AC
Start: 2023-10-02 — End: ?

## 2023-10-02 MED ORDER — ARIPIPRAZOLE 5 MG PO TABS: 5.0000 mg | ORAL_TABLET | Freq: Every day | ORAL | 1 refills | Status: AC

## 2023-10-02 MED ORDER — ESCITALOPRAM OXALATE 10 MG PO TABS: 15.0000 mg | ORAL_TABLET | Freq: Every day | ORAL | 1 refills | Status: AC

## 2023-10-02 NOTE — Telephone Encounter (Signed)
 Ok to send 90 day supplies of all the above medications

## 2023-10-02 NOTE — Progress Notes (Unsigned)
 [facility] Counselor Adult Comprehensive Clinical Assessment - TELEMEDICINE   Name: Jonathan Gross Date: 10/02/2023 MRN: [id number] DOB: Jul 29, 1965 PCP: Ozell Heron HERO, MD  Session Time start: 1035 End time: 1135 Total time: 60  min  Client/Family location: [place], [place] Therapist location: [facility] [facility] - [place], [place] All persons participating in visit: Client & this therapist  I connected with client and/or family via Engineer, civil (consulting)  (Video is Caregility application) and verified that I am speaking with the correct person using two identifiers. Discussed confidentiality: Yes   I discussed the limitations of telemedicine and the availability of in person appointments.  Discussed there is a possibility of technology failure and discussed alternative modes of communication if that failure occurs.  I discussed that engaging in this telemedicine visit, they consent to the provision of behavioral healthcare and the services will be billed under their insurance.  Patient and/or legal guardian expressed understanding and consented to Telemedicine visit: Yes   Types of Service: Comprehensive Clinical Assessment (CCA) and Video visit  Guardian/Payee:  Self    Paperwork requested: No   Reason for referral in patient/family's own words:  - Goal is to get back into therapy - Testing for Adult Autism since it was recently mentioned by Dr. Ozell, his PCP, so he was agreeable to it. (ADHD Eval was about 20 years ago) - Coping strategies to manage daily life  Client likes to be called Jonathan Gross.  Primary language at home is [place].   Standardized Assessments completed: GAD-7 and PHQ 9  10/02/2023  GAD 7 : Generalized Anxiety Score   Nervous, Anxious, on Edge 3   Control/stop worrying 3   Worry too much - different things 3   Trouble relaxing 3   Restless 3   Easily annoyed or irritable 3   Afraid - awful might happen 2   Total GAD 7 Score  20   Anxiety Difficulty Very difficult   PHQ-9 Depression Screening Tool   Decreased Interest 1   Down, Depressed, Hopeless 1   Altered sleeping 1   Tired, decreased energy 3   Change in appetite 2   Feeling bad or failure about yourself 1   Trouble concentrating 2   Moving slowly or fidgety/restless 2   Suicidal thoughts 0   PHQ-9 Score 13      Risk Assessment: Danger to Self:  No Self-injurious Behavior: No Danger to Others: No Duty to Warn:no Physical Aggression / Violence:No  Access to Firearms a concern: Not asked   Client and/or legal guardian was educated about steps to take if suicide or homicide risk level increases between visits: n/a While future psychiatric events cannot be accurately predicted, the patient does not currently require acute inpatient psychiatric care and does not currently meet [place]  involuntary commitment criteria.  Current Concerns or Stressors:  Work Advertising copywriter with others - When people don't understand his instructions or expectations, it makes him angry or frustrated, - Another frustration that he reported is when people are not helping out and he felt he Shouldn't have to ask for help  Sensory sensitivities - sunlight, bright light, some smells Don't like people touching him.   Client and/or Family's Strengths/Protective Factors: Enjoy helping others - to help them be better It upsets him if he can't find the answers for others that ask him for help  He also enjoys shopping for a good price, fishing supplies, researches things but Gets in trouble' for spending money  Current  Health Habits: Sleep:   Bedtime 8:30pm/9pm, maybe 10pm/11pm Wake up 3:30am Get up 4:30am/5:30am- usually work  Routine in the [employer] Can start walking in the morning  again like he did during Covid 19 pandemic  Physical Activities: Work around [employer] walks or walking by  himself  Eating/Appetite: 7am breakfast, 12pm lunch & 6pm dinner Snacks- eating healthier snacks and stopped eating snacks after dinner. Loves to eat fresh fish  Current Medications and therapies:  Psychotropic medications: Current medications: Abilify , Lexapro  (taking since April 2025) Client taking medications as prescribed:  Yes, started it again last night, missed a couple doses when he was on vacation Side effects reported: Vivid dreams - denied any problematic side effects  Therapies:  Behavioral therapy - Last experience made him feel worse so he stopped therapy at the 8th session. He reported that he talked about the things that made him angry and he would continue to feel that way throughout the day.  Jonathan Gross requested that during therapy he needs help with ending the session with not being angry or frustrated since it lasts throughout the day.  Psychiatric Review of systems: Insomnia: No Changes in appetite: No Decreased need for sleep: No Hallucinations: No   Paranoia: No    Psychiatric History: Past psychiatry diagnosis: Anxiety, Depression 2000-2005 - Depression, Anger & Anxiety, Severe panic attack - passed out at work - 3 days in the hospital - found nothing was wrong, was put on lorazepam  at that time - haven't taken lorazepam  as needed - started therapy at that time (8 sessions)  Around 2015-2016 - he started forgetting things and spoke with PCP about his concerns, referred for ADHD evaluation at that time. Client's son was evaluated and diagnosed with ADHD at [facility] so he was evaluated there as well.   Patient currently being seen by therapist/psychiatrist:  Not currently, has seen therapist in the past. 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 - converted when children were born Very Catholic   Any cultural differences that may affect / interfere with treatment:  N/A  Current or History of Alcohol/Substance use: Do you use Caffeine? 5 cups of coffee, no energy drinks, drink 3 colas/day Have you recently consumed alcohol? no  Have you recently used any drugs, eg marijuana, other substances or prescriptions drugs not prescribed to them?  no  Have you recently consumed any tobacco or nicotine? No, not since 7[address]74 Does client seem concerned about dependence or abuse of any substance? no  Traumatic Experiences/Abuse history: Need additional information  Family of Origin (Childhood History)  Family history: Family mental illness:  No formal diagnosis per client Family history of bipolar disorder: No information Family school achievement history:  Client believes there are family members that have mental health illness, ADHD, Autism   Family History:  Family History  Problem Relation Age of Onset   Diabetes Mother  Rheum arthritis Mother    Depression Mother    High blood pressure Mother    Osteoarthritis Father    Heart disease Father 68   High Cholesterol Father    High blood pressure Father    Hypertension Sister    Hyperlipidemia Sister    Heart disease Sister        cabg age 9   Healthy Brother    Diabetes Maternal Grandmother    High blood pressure Maternal Grandmother    High Cholesterol Maternal Grandmother    Heart attack Maternal Grandfather    Heart disease  Maternal Grandfather    Heart disease Paternal Grandfather    Testicular cancer Son    Colon cancer Neg Hx    Colon polyps Neg Hx    Esophageal cancer Neg Hx    Rectal cancer Neg Hx    Stomach cancer Neg Hx      Client Medical history  Medical History/Surgical History: not reviewed' Past Medical History:  Diagnosis Date   ANXIETY 08/05/2008   DIABETES MELLITUS, TYPE II 09/08/2006   Family history of adverse reaction to anesthesia    father-has intubation issues-small throat, son-nausea and vomiting   GERD 09/08/2006   HYPERTENSION 09/08/2006   PANIC DISORDER, HX OF 10/30/2006    Past Surgical History:  Procedure Laterality Date   APPENDECTOMY  01/11/1995   BIOPSY  06/16/2022   Procedure: BIOPSY;  Surgeon: Wilhelmenia Aloha Raddle., MD;  Location: [facility];  Service: Gastroenterology;;   COLONOSCOPY     ESOPHAGOGASTRODUODENOSCOPY (EGD) WITH PROPOFOL  N/A 07/16/2015   Procedure: ESOPHAGOGASTRODUODENOSCOPY (EGD) WITH PROPOFOL ;  Surgeon: Toribio SHAUNNA Cedar, MD;  Location: [facility];  Service: Endoscopy;  Laterality: N/A;   ESOPHAGOGASTRODUODENOSCOPY (EGD) WITH PROPOFOL  N/A 06/16/2022   Procedure: ESOPHAGOGASTRODUODENOSCOPY (EGD) WITH PROPOFOL ;  Surgeon: Wilhelmenia Aloha Raddle., MD;  Location: [facility];  Service: Gastroenterology;  Laterality: N/A;   EUS N/A 07/16/2015   Procedure: UPPER ENDOSCOPIC ULTRASOUND (EUS) RADIAL;  Surgeon: Toribio SHAUNNA Cedar, MD;  Location: [facility];  Service: Endoscopy;  Laterality: N/A;   EUS N/A 06/16/2022   Procedure: UPPER ENDOSCOPIC ULTRASOUND (EUS) RADIAL;  Surgeon: Wilhelmenia Aloha Raddle., MD;  Location: [facility];  Service: Gastroenterology;  Laterality: N/A;   FINE NEEDLE ASPIRATION N/A 07/16/2015   Procedure: FINE NEEDLE ASPIRATION (FNA) LINEAR;  Surgeon: Toribio SHAUNNA Cedar, MD;  Location: [facility];  Service: Endoscopy;  Laterality: N/A;   FINE NEEDLE ASPIRATION N/A 06/16/2022   Procedure: FINE NEEDLE ASPIRATION (FNA) LINEAR;  Surgeon:  Wilhelmenia Aloha Raddle., MD;  Location: [facility];  Service: Gastroenterology;  Laterality: N/A;   TOE AMPUTATION Right 01/10/2006   great and 2nd toe   VASECTOMY  01/10/1997    Medications: Current Outpatient Medications  Medication Sig Dispense Refill   ARIPiprazole  (ABILIFY ) 5 MG tablet Take 1 tablet (5 mg total) by mouth daily. 90 tablet 1   aspirin 81 MG tablet Take 81 mg by mouth daily.     atorvastatin  (LIPITOR) 10 MG tablet Take 1 tablet (10 mg total) by mouth daily. 90 tablet 1   BD PEN NEEDLE NANO 2ND GEN 32G X 4 MM MISC USE AS DIRECTED 300 each 1   Continuous Glucose Sensor (FREESTYLE LIBRE 14 DAY SENSOR) MISC APPLY EVERY 14 DAYS 6 each 5   escitalopram  (LEXAPRO ) 10 MG tablet Take 1.5 tablets (15 mg total) by mouth daily. 135 tablet 1   insulin  glargine, 2 Unit Dial, (TOUJEO  MAX SOLOSTAR) 300 UNIT/ML Solostar Pen Inject 40 Units into the skin  at bedtime. 12 mL 1   lisinopril  (ZESTRIL ) 20 MG tablet TAKE 1 TABLET BY MOUTH EVERY DAY 90 tablet 1   LORazepam  (ATIVAN ) 0.5 MG tablet TAKE 1 TABLET DAILY AS NEEDED FOR PANIC ATTACK 30 tablet 0   meclizine  (ANTIVERT ) 25 MG tablet CHEW AND SWALLOW 1 TABLET BY MOUTH 3 TIMES A DAY AS NEEDED 32 tablet 0   mupirocin  ointment (BACTROBAN ) 2 % Apply 1 Application topically 2 (two) times daily. Apply for 7-14 days. 22 g 1   ondansetron  (ZOFRAN -ODT) 4 MG disintegrating tablet Take 1 tablet (4 mg total) by mouth every 8 (eight) hours as needed for nausea or vomiting (for nausea from wegovy  or other source). 20 tablet 0   pioglitazone  (ACTOS ) 15 MG tablet Take 1 tablet (15 mg total) by mouth daily. 90 tablet 1   sildenafil  (VIAGRA ) 50 MG tablet TAKE ONE TABLET BY MOUTH DAILY AS NEEDED FOR ERECTILE DYSFUNCTION 10 tablet 5   SYNJARDY  XR 12.05-998 MG TB24 TAKE 2 TABLETS BY MOUTH WITH BREAKFAST 180 tablet 0   tirzepatide  (MOUNJARO ) 15 MG/0.5ML Pen Inject 15 mg into the skin once a week. 2 mL 5   No current facility-administered medications for this  visit.    Allergies  Allergen Reactions   Amoxicillin Rash     Interventions: Interventions utilized: This Clinician reviewed information on new patient paperwork, explained services, identified presenting concerns, explored goals and built rapport. Obtained additional information for comprehensive assessment and developed general plan of care. Psychoeducation and/or Health Education - Mental health benefits of physical activities  Client Response:  Mr. Pettengill, who prefers to be called Glendia was initially guarded about therapy due to a previous experience.  However, he was able to articulate his needs and what will help him have a better experience during therapy.  Mr. Doty presents with severe anxiety and moderate depressive symptoms.  He reported becoming easily angry and open to strategies to become less angry & anxious.  Mr. Ramakrishnan is open to individual psycho therapy and agreed to start working on increasing his physical activities to improve hs mood.   Mental status exam:   General Appearance Siegfried:  Neat and Guarded Eye Contact:  Minimal Motor Behavior:  Normal Speech:  Normal Level of Consciousness:  Alert Mood:  Angry, Anxious, and Depressed Affect:  Appropriate and Guarded Anxiety Level:  Severe Thought Process:  Coherent Thought Content:  WNL Perception:  Normal Judgment:  Good Insight:  Present   Clinical Assessment:  Mr. Markovitz is a 59 yo who presents with anxiety and depressive symptoms. He reported becoming angry easily and ongoing difficulties with sleep.  He also reported a history of an ADHD evaluation.  Mr. Slomski has tried various treatment options for anxiety, depression & ADHD but was having adverse effects. His current medications for anxiety & depression have been more effective than previous medications.  Mr. Lipke would benefit from further evaluation of bio psycho social factors affecting his daily functioning and relationships.     Diagnosis: Generalized anxiety disorder  Coordination of Care: [facility] Testing Team to ensure he is on the wait list for a Adult Autism Evaluation   Recommendations for Services/Supports/Treatments: Individual psychotherapy  Mr. Winterrowd to increase his physical activities - 3 days/week walking in the morning with dogs This Clinician will send coping strategies to address daily stressors that he can review.   Follow up Plan: A follow-up was scheduled to create a more specific treatment plan and begin treatment. Therapist answered all questions during  the evaluation and contact information was provided.   Wyley Hack P. Trudy, MSW, LCSW [employer] Therapist Main Office: [phone]   Individualized Treatment Plan  Elvin participated in the creation of the treatment plan)  This plan will be reviewed at least every 12 months.   Client Abilities/Strengths Christine Schiefelbein helping others - to help them be better He also enjoys shopping for a good price, fishing supplies, researches things    Supports: Spouse   Goal/Needs for Treatment:  In order of importance to patient 1) Learn to implement coping strategies to manage his anxiety & anger    Client Statement of Needs Brewer would like to implement strategies to cope with daily stressors    Treatment Level 2-3 times/month - Virtually (Telemedicine)   Symptoms  Anxiety    Depression   Client Treatment Preferences:Virtual Visits   Client will: Actively participate in therapy, working towards healthy functioning.   Therapist will: Provide referrals for additional resources as appropriate.  Provide psycho-education and corresponding treatment approaches and interventions. Abdulrahman Bracey [place], LCSW will support the clients's ability to achieve the goals identified.    *Justification for Continuation/Discontinuation of Goal: R=Revised, O=Ongoing, A=Achieved, D=Discontinued  Goal 1) Increase knowledge and  ability to implement coping strategies to manage daily stressors as evidenced by self-report. Target Date Goal Was reviewed Status Code Progress towards goal/Likert rating  01/10/2024                 Electronic signature sign off request for treatment plan sent via MyChart on 10/04/2023.

## 2023-10-03 ENCOUNTER — Encounter: Payer: Self-pay | Admitting: Clinical

## 2023-10-23 ENCOUNTER — Ambulatory Visit: Admitting: Clinical

## 2023-10-23 DIAGNOSIS — F411 Generalized anxiety disorder: Secondary | ICD-10-CM | POA: Diagnosis not present

## 2023-10-23 NOTE — Progress Notes (Unsigned)
 [facility] Counselor Progress Note - TELEMEDICINE VISIT  Patient ID: Jonathan Gross, MRN: [id number]    Date: 10/23/2023  Time Spent: 10:30am  - 11:15am  : 45 Minutes  Types of Service: Individual psychotherapy and Video visit  Client and/or Legal Guardian location: [place], [place] Therapist location: [facility] [place] office All persons participating in visit: Client & this therapist  I connected with client and/or legal guardian via Video Enabled Telemedicine Application  (Video is Caregility application) and verified that I am speaking with the correct person using two identifiers. Discussed confidentiality: Yes   I discussed the limitations of telemedicine and the availability of in person appointments.  Discussed there is a possibility of technology failure and discussed alternative modes of communication if that failure occurs.  I discussed that engaging in this telemedicine visit, they consent to the provision of behavioral healthcare and the services will be billed under their insurance.  Client and/or legal guardian expressed understanding and consented to Telemedicine visit: Yes    Presenting Concerns:  - Preparing for increased stress with projects coming up and preparing for taking care of elderly parents in the future  Mental Status Exam: Appearance:  Neat     Behavior: Appropriate  Motor: Normal  Speech/Language:  Normal Rate  Affect: Appropriate  Mood: normal  Thought process: normal  Thought content:   WNL  Sensory/Perceptual disturbances:   WNL  Orientation: oriented to person, place, time/date, situation, and day of week  Attention: Good  Concentration: Fair  Memory: WNL  Fund of knowledge:  Good  Insight:   Good  Judgment:  Good  Impulse Control: Good   Risk Assessment: Danger to Self:  No Self-injurious Behavior: No Danger to Others: No Duty to Warn:no   Subjective:  Jonathan Gross reported that he had a good work trip and felt it his  emotions were regulated, which has helped him stay calm over the weekend.  Interventions: Solution-Oriented/Positive Psychology  Client Response: Jonathan Gross was able to identify the specific things he did to help him regulate his emotions. On the 12 days of his business trip, he ws physically active due to walking around in the facilities, he slept better, and he was intentional in making sure he took his medicine consistently during his trip.  Jonathan Gross reported that he can do the following things in the next couple weeks: Walk the dog - 2x/ week morning Stay on meds 30 min walk at [facility] - 3 times a week to get fresh air Snacking - eat meals  He also shared insights about himself: Running into the fire.- compelled to interact and go into things that need immediate attention Nurturing person Tends to put his needs last  Diagnosis:  Generalized anxiety disorder   Goals, Assessment & Plan:   Jonathan Gross was able to identify strategies that has helped him regulate his emotions and his responses with others.  Treatment Level 2-3 times a month  Modality - TeleMedicine - Video  Goal:   Increase knowledge and ability to implement coping strategies to manage daily stressors as evidenced by self-report.   Target Date: 01/10/2024  Progress: Ongoing    Pretty Weltman P. Trudy, MSW, LCSW [employer] Therapist Main Office: [phone]

## 2023-11-06 ENCOUNTER — Ambulatory Visit (INDEPENDENT_AMBULATORY_CARE_PROVIDER_SITE_OTHER): Admitting: Clinical

## 2023-11-06 DIAGNOSIS — F411 Generalized anxiety disorder: Secondary | ICD-10-CM

## 2023-11-06 NOTE — Progress Notes (Unsigned)
 [facility] Counselor Progress Note - TELEMEDICINE VISIT  Patient ID: Erice Ahles, MRN: [id number]    Date: 11/06/2023  Time Spent: 11:32am  - 12:04pm  : 32 Minutes  Types of Service: Individual psychotherapy and Video visit  Client and/or Legal Guardian location: [employer], [place] Therapist location: [facility] [place], [place] All persons participating in visit: Client & this therapist  I connected with client and/or legal guardian via Engineer, Civil (consulting)  (Video is Surveyor, mining) and verified that I am speaking with the correct person using two identifiers. Discussed confidentiality: Yes   I discussed the limitations of telemedicine and the availability of in person appointments.  Discussed there is a possibility of technology failure and discussed alternative modes of communication if that failure occurs.  I discussed that engaging in this telemedicine visit, they consent to the provision of behavioral healthcare and the services will be billed under their insurance.  Client and/or legal guardian expressed understanding and consented to Telemedicine visit: Yes    Presenting Concerns: ***  Mental Status Exam: Appearance:  {PSY:22683}     Behavior: {PSY:21022743}  Motor: {PSY:22302}  Speech/Language:  {PSY:22685}  Affect: {PSY:22687}  Mood: {PSY:31886}  Thought process: {PSY:31888}  Thought content:   {PSY:905-113-3666}  Sensory/Perceptual disturbances:   {PSY:947-257-6594}  Orientation: {PSY:30297}  Attention: {PSY:22877}  Concentration: {[phone]}  Memory: {[phone]}  Fund of knowledge:  {[phone]}  Insight:   {[phone]}  Judgment:  {[phone]}  Impulse Control: {[phone]}   Risk Assessment: Danger to Self:  {PSY:22692} Self-injurious Behavior: {PSY:22692} Danger to Others: {PSY:22692} Duty to Warn:{PSY:311194}   Subjective:  ***  Tired a lot, watch  TV  Interventions: {PSY:[phone]}  Client Response: ***  Taking the week off at Thanksgiving since work will increase in November & December. He has spoken to his manager to support him by helping him stay on track with meeting deadlines.  Worried about his parents - only and a cousin that will be available to support them.  He was agreeable to receiving information about resources in the community to support caregivers.  This clinician will send info about [employer] & other programs/organizations that can help him.  Diagnosis:  No diagnosis found.   Goals, Assessment & Plan:   ***  Treatment Level {Frequency of sessions.:26745}  Modality - TeleMedicine - Video  Goal:   Increase knowledge and ability to implement coping strategies to manage daily stressors as evidenced by self-report.   Review last weeks objectives: Walk the dog - 2x/ week morning Stay on meds 30 min walk at [facility] - 3 times a week to get fresh air Snacking - eat meals   Target Date: 01/10/2024  Progress: Ongoing       Goals: ***  Target Date: ***  Progress: ***    Kylieann Eagles P. Trudy, MSW, LCSW [employer] Therapist Main Office: [phone]

## 2023-11-09 ENCOUNTER — Encounter: Payer: Self-pay | Admitting: Clinical

## 2023-11-29 ENCOUNTER — Ambulatory Visit (INDEPENDENT_AMBULATORY_CARE_PROVIDER_SITE_OTHER): Admitting: Clinical

## 2023-11-29 DIAGNOSIS — F411 Generalized anxiety disorder: Secondary | ICD-10-CM

## 2023-11-29 NOTE — Progress Notes (Signed)
 [facility] Counselor Progress Note - TELEMEDICINE VISIT  Patient ID: Jonathan Gross, MRN: [id number]    Date: 11/29/2023  Time Spent: 9:30am   - 10am  : 30 Minutes  Types of Service: Individual psychotherapy and Video visit  Client and/or Legal Guardian location: [employer], [place] Therapist location: [facility] [employer], [place] All persons participating in visit: Client & this therapist  I connected with client and/or legal guardian via Engineer, Civil (consulting)  (Video is Surveyor, mining) and verified that I am speaking with the correct person using two identifiers. Discussed confidentiality: Yes   I discussed the limitations of telemedicine and the availability of in person appointments.  Discussed there is a possibility of technology failure and discussed alternative modes of communication if that failure occurs.  I discussed that engaging in this telemedicine visit, they consent to the provision of behavioral healthcare and the services will be billed under their insurance.  Client and/or legal guardian expressed understanding and consented to Telemedicine visit: Yes    Presenting Concerns:  - Increased family stressors   Mental Status Exam: Appearance:  Neat     Behavior: Appropriate  Motor: Normal  Speech/Language:  Normal Rate  Affect: Appropriate  Mood: anxious  Thought process: normal  Thought content:   WNL  Sensory/Perceptual disturbances:   WNL  Orientation: oriented to person, place, time/date, situation, and day of week  Attention: Good  Concentration: Good  Memory: WNL  Fund of knowledge:  Good  Insight:   Good  Judgment:  Good  Impulse Control: Good   Risk Assessment: Danger to Self:  No Self-injurious Behavior: No Danger to Others: No Duty to Warn:no   Subjective:   Increased stressors with family situation that he just learned about, pertaining to his brother.  Interventions:  Solution-Oriented/Positive Psychology  Client Response: Jonathan Gross reported feeling more stressed on how to support his parents through the situation with his brother.  He acknowledged that he doesn't have control over the situation and he's doing what he can for his parents. He was open to strategies when listening to his parents about things he can't control.  He reported that in the last 2 weeks during his [place] work trip, he was walking a lot, ate regularly, and slept well.  He developed plan for his upcoming vacation to ensure he gets rest, since that's his goal, and not over due himself with various tasks or activities. He will fish and walk the dogs.  He will also try to check in with himself on how he's feeling, eg relaxed, stressed, etc.   Diagnosis:  Generalized anxiety disorder   Goals, Assessment & Plan:   Jonathan Gross has been able to regulate his emotions and manage various stressors due to being more intentional with implementing self-care strategies.  Treatment Level 2-3 times/month   Modality - TeleMedicine - Video   Goal:   Increase knowledge and ability to implement coping strategies to manage daily stressors as evidenced by self-report.  - [name] was able to consistently have physical activities, nutrition and sleep during his work trip. - He plans to implement time for him to rest during his upcoming vacation with his family.     Target Date: 01/10/2024  Progress: Ongoing        Goals: Increase knowledge and/or identification of support systems that he can utilize to support him with caring for his elderly parents. - He acknowledged that he was doing what he can at this time for his  parents.   Target Date: 10/10/2024  Progress: Ongoing    Lavena Loretto P. Trudy, MSW, LCSW [employer] Therapist Main Office: [phone]

## 2023-12-13 ENCOUNTER — Ambulatory Visit: Admitting: Family Medicine

## 2023-12-13 ENCOUNTER — Encounter: Payer: Self-pay | Admitting: Family Medicine

## 2023-12-13 VITALS — BP 140/90 | HR 93 | Temp 98.0°F | Ht 67.0 in | Wt 206.8 lb

## 2023-12-13 DIAGNOSIS — I10 Essential (primary) hypertension: Secondary | ICD-10-CM | POA: Diagnosis not present

## 2023-12-13 DIAGNOSIS — L299 Pruritus, unspecified: Secondary | ICD-10-CM

## 2023-12-13 DIAGNOSIS — Z7985 Long-term (current) use of injectable non-insulin antidiabetic drugs: Secondary | ICD-10-CM

## 2023-12-13 DIAGNOSIS — E1169 Type 2 diabetes mellitus with other specified complication: Secondary | ICD-10-CM

## 2023-12-13 DIAGNOSIS — Z23 Encounter for immunization: Secondary | ICD-10-CM | POA: Diagnosis not present

## 2023-12-13 LAB — POCT GLYCOSYLATED HEMOGLOBIN (HGB A1C): Hemoglobin A1C: 10.4 % — AB (ref 4.0–5.6)

## 2023-12-13 MED ORDER — TRIAMCINOLONE ACETONIDE 0.1 % EX CREA: 1.0000 | TOPICAL_CREAM | Freq: Two times a day (BID) | CUTANEOUS | 1 refills | Status: AC

## 2023-12-13 MED ORDER — ATORVASTATIN CALCIUM 10 MG PO TABS: 10.0000 mg | ORAL_TABLET | Freq: Every day | ORAL | 1 refills | Status: AC

## 2023-12-13 MED ORDER — MOUNJARO 15 MG/0.5ML ~~LOC~~ SOAJ: 15.0000 mg | SUBCUTANEOUS | 5 refills | Status: AC

## 2023-12-13 MED ORDER — LISINOPRIL 20 MG PO TABS: 20.0000 mg | ORAL_TABLET | Freq: Every day | ORAL | 1 refills | Status: AC

## 2023-12-13 MED ORDER — FREESTYLE LIBRE 3 PLUS SENSOR MISC: 3 refills | Status: AC

## 2023-12-13 MED ORDER — TOUJEO MAX SOLOSTAR 300 UNIT/ML ~~LOC~~ SOPN: 40.0000 [IU] | PEN_INJECTOR | Freq: Every evening | SUBCUTANEOUS | 3 refills | Status: AC

## 2023-12-13 MED ORDER — SYNJARDY XR 12.5-1000 MG PO TB24: ORAL_TABLET | ORAL | 1 refills | Status: AC

## 2023-12-13 NOTE — Progress Notes (Signed)
 Established Patient Office Visit  Subjective   Patient ID: Jonathan Gross, male    DOB: [birth year]  Age: 58 y.o. MRN: [id number]  Chief Complaint  Patient presents with   Medical Management of Chronic Issues    HPI Discussed the use of AI scribe software for clinical note transcription with the patient, who gave verbal consent to proceed.  History of Present Illness   Jonathan Gross is a 58 year old male with diabetes who presents for follow-up due to elevated A1c levels.  His A1c is 10.4%. Mounjaro  was supposed to be increased from 10 mg to 15 mg but the order did not go through, and he has remained on 10 mg. This is the second time this has happened.  He reports a rough period since the last visit with disrupted routine from international travel and increased intake of large meals. His diet has been poor but he recently started avoiding snacks and meals after 6 PM and has begun walking after lunch. He has not added walking after other meals yet.  He takes Abilify  5 mg at night and feels it helps stabilize his mood. He has started therapy and finds it beneficial.  He has itching in soft areas such as under his arms and in his groin without visible rash. He uses lotion and Calamine with partial relief. He does not think this is due to dry weather.  Current medications include Toujeo  40 units, lisinopril , pioglitazone , Lexapro , Synjardy , and a cholesterol medication. His weight is about 202 lb, slightly above his usual 196 to 199 lb. He confirms taking lisinopril  and other medications as prescribed.       Current Outpatient Medications  Medication Instructions   ARIPiprazole  (ABILIFY ) 5 mg, Oral, Daily   aspirin 81 mg, Daily   atorvastatin  (LIPITOR) 10 mg, Oral, Daily   BD PEN NEEDLE NANO 2ND GEN 32G X 4 MM MISC See admin instructions   Continuous Glucose Sensor (FREESTYLE LIBRE 3 PLUS SENSOR) MISC Change sensor every 15 days.   Empagliflozin-metFORMIN HCl ER (SYNJARDY   XR) 12.05-998 MG TB24 TAKE 2 TABLETS BY MOUTH WITH BREAKFAST   escitalopram  (LEXAPRO ) 15 mg, Oral, Daily   lisinopril  (ZESTRIL ) 20 mg, Oral, Daily   LORazepam  (ATIVAN ) 0.5 MG tablet TAKE 1 TABLET DAILY AS NEEDED FOR PANIC ATTACK   meclizine  (ANTIVERT ) 25 MG tablet CHEW AND SWALLOW 1 TABLET BY MOUTH 3 TIMES A DAY AS NEEDED   Mounjaro  15 mg, Subcutaneous, Weekly   mupirocin  ointment (BACTROBAN ) 2 % 1 Application, Topical, 2 times daily, Apply for 7-14 days.   ondansetron  (ZOFRAN -ODT) 4 mg, Oral, Every 8 hours PRN   pioglitazone  (ACTOS ) 15 mg, Oral, Daily   sildenafil  (VIAGRA ) 50 MG tablet TAKE ONE TABLET BY MOUTH DAILY AS NEEDED FOR ERECTILE DYSFUNCTION   Toujeo  Max SoloStar 40 Units, Subcutaneous, Nightly   triamcinolone cream (KENALOG) 0.1 % 1 Application, Topical, 2 times daily    Patient Active Problem List   Diagnosis Date Noted   Vertigo 05/20/2022   Difficulty sleeping 04/04/2022   Pure hypercholesterolemia 04/04/2022   Anxiety state 03/22/2022   Long term current use of insulin  (HCC) 03/22/2022   Type 2 diabetes mellitus with hyperglycemia (HCC) 11/18/2021   Hyperlipidemia associated with type 2 diabetes mellitus (HCC) 10/21/2019   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     Objective:     BP (!) 140/90   Pulse 93   Temp 98 F (36.7 C) (Oral)   Ht 5' 7 (1.702 m)   Wt 206 lb 12.8 oz (93.8 kg)   SpO2 97%   BMI 32.39 kg/m    Physical Exam Vitals reviewed.  Constitutional:      Appearance: Normal appearance. He is well-groomed and normal weight.  Eyes:     Conjunctiva/sclera: Conjunctivae normal.  Cardiovascular:     Rate and Rhythm: Normal rate and regular rhythm.     Heart sounds: S1 normal and S2  normal. No murmur heard. Pulmonary:     Effort: Pulmonary effort is normal.     Breath sounds: Normal breath sounds and air entry. No rales.  Musculoskeletal:     Right lower leg: No edema.     Left lower leg: No edema.  Neurological:     General: No focal deficit present.     Mental Status: He is alert and oriented to person, place, and time.     Gait: Gait is intact.  Psychiatric:        Mood and Affect: Mood normal. Affect is flat.      Results for orders placed or performed in visit on 12/13/23  POC HgB A1c  Result Value Ref Range   Hemoglobin A1C 10.4 (A) 4.0 - 5.6 %   HbA1c POC (<> result, manual entry)     HbA1c, POC (prediabetic range)     HbA1c, POC (controlled diabetic range)        The ASCVD Risk score (Arnett DK, et al., 2019) failed to calculate for the following reasons:   The valid total cholesterol range is 130 to 320 mg/dL    Assessment & Plan:  Type 2 diabetes mellitus with other specified complication, without long-term current use of insulin  (HCC) -     POCT glycosylated hemoglobin (Hb A1C) -     Collection capillary blood specimen -     FreeStyle Libre 3 Plus Sensor; Change sensor every 15 days.  Dispense: 6 each; Refill: 3 -     Mounjaro ; Inject 15 mg into the skin once a week.  Dispense: 2 mL; Refill: 5 -     Atorvastatin  Calcium ; Take 1 tablet (10 mg total) by mouth daily.  Dispense: 90 tablet; Refill: 1 -     Synjardy  XR; TAKE 2 TABLETS BY MOUTH WITH BREAKFAST  Dispense: 180 tablet; Refill: 1 -     Toujeo  Max SoloStar; Inject 40 Units into the skin at bedtime.  Dispense: 12 mL; Refill: 3  Essential hypertension -     Lisinopril ; Take 1 tablet (20 mg total) by mouth daily.  Dispense: 90 tablet; Refill: 1  Pruritus of skin -     Triamcinolone Acetonide; Apply 1 Application topically 2 (two) times daily.  Dispense: 80 g; Refill: 1   Assessment and Plan    Type 2 diabetes mellitus with other specified complication A1c increased to 10.4,  indicating poor glycemic control. He has been on Mounjaro  10 mg instead of the prescribed 15 mg due to pharmacy issues. Recent travel and dietary changes may have contributed to the increase. He has started a new dietary routine and is attempting to increase physical activity. Weight is stable at 202 lbs, with a history of fluctuation between 196-199 lbs. Pruritus may be related to elevated blood sugars. - Sent Mounjaro  prescription  for 15 mg to pharmacy. -Continue Synjardy  12.05/998 mg 2 tablets daily.  - Provided a year's supply of FreeStyle Libre 3 Plus sensors. - Continue current insulin  regimen with Toujeo  40 units. - Encouraged adherence to new dietary routine and increased physical activity. - Will schedule follow-up in 3 months for A1c re-evaluation and foot exam.  Essential hypertension Blood pressure is borderline high at 138/90 mmHg. Currently on lisinopril . Plan to focus on improving blood sugar control first, as this may positively impact blood pressure. - Continue current lisinopril  regimen. - Monitor blood pressure and will reassess in 3 months.  Pruritus of skin Reports itching without visible rash, possibly related to dry skin or elevated blood sugars. No evidence of drug reaction or allergy. Itching is managed with lotion and Calamine. - Prescribed triamcinolone cream for application to intensely itchy areas up to twice daily. - Recommended over-the-counter antihistamines like Benadryl , Zyrtec, or Claritin for histamine response. - Advised moisturizing skin regularly to prevent dryness and cracking.  General Health Maintenance Due for flu shot and reminded patient that he is due for his eye exam. - Administered flu shot. - Schedule eye exam in March with his provider.        Return in about 3 months (around 03/12/2024) for DM, HTN.    Heron CHRISTELLA Sharper, MD

## 2023-12-15 ENCOUNTER — Other Ambulatory Visit: Payer: Self-pay | Admitting: Family Medicine

## 2023-12-15 DIAGNOSIS — E1100 Type 2 diabetes mellitus with hyperosmolarity without nonketotic hyperglycemic-hyperosmolar coma (NKHHC): Secondary | ICD-10-CM

## 2023-12-25 ENCOUNTER — Ambulatory Visit: Admitting: Clinical

## 2023-12-25 DIAGNOSIS — F411 Generalized anxiety disorder: Secondary | ICD-10-CM

## 2023-12-25 NOTE — Progress Notes (Unsigned)
 [facility] Counselor Progress Note - TELEMEDICINE VISIT  Patient ID: Brighten Orndoff, MRN: [id number]    Date: 12/25/2023  Time Spent: 12:58pm  - 1:27pm : 29 Minutes  Types of Service: Individual psychotherapy and Video visit  Client and/or Legal Guardian location: [employer], [place] Therapist location: [facility] [place] - [place], [place] All persons participating in visit: Client & this therapist  I connected with client and/or legal guardian via Engineer, Civil (consulting)  (Video is Caregility application) and verified that I am speaking with the correct person using two identifiers. Discussed confidentiality: Yes   I discussed the limitations of telemedicine and the availability of in person appointments.  Discussed there is a possibility of technology failure and discussed alternative modes of communication if that failure occurs.  I discussed that engaging in this telemedicine visit, they consent to the provision of behavioral healthcare and the services will be billed under their insurance.  Client and/or legal guardian expressed understanding and consented to Telemedicine visit: Yes    Presenting Concerns: ***  Mental Status Exam: Appearance:  {PSY:22683}     Behavior: {PSY:21022743}  Motor: {PSY:22302}  Speech/Language:  {PSY:22685}  Affect: {PSY:22687}  Mood: {PSY:31886}  Thought process: {PSY:31888}  Thought content:   {PSY:706-642-8036}  Sensory/Perceptual disturbances:   {PSY:214-824-2853}  Orientation: {PSY:30297}  Attention: {PSY:22877}  Concentration: {PSY:([phone]}  Memory: {PSY:4064308940}  Fund of knowledge:  {PSY:(857)321-0504}  Insight:   {PSY:(857)321-0504}  Judgment:  {PSY:(857)321-0504}  Impulse Control: {PSY:([phone]}   Risk Assessment: Danger to Self:  {PSY:22692} Self-injurious Behavior: {PSY:22692} Danger to Others: {PSY:22692} Duty to Warn:{PSY:311194}   Subjective:  ***   Interventions:  {PSY:803-045-9290}  Client Response: ***   Diagnosis:  Generalized anxiety disorder   Goals, Assessment & Plan:   ***  Treatment Level 2-3 times/month  Modality - TeleMedicine - Video  Treatment Level 2-3 times/month   Modality - TeleMedicine - Video   Goal:   Increase knowledge and ability to implement coping strategies to manage daily stressors as evidenced by self-report.   *** [name] reported he's been doing well overall managing his stressors.   Target Date: 01/10/2024  Progress: Completed     Plan:   Mr. Ginsberg reported he does not need therapy at this time and doing well overall.  He stated that he will call if he needs additional support in the future.  Acadia Thammavong P. Trudy, MSW, LCSW [employer] Therapist Main Office: [phone]

## 2024-01-10 ENCOUNTER — Telehealth: Payer: Self-pay

## 2024-01-10 NOTE — Progress Notes (Signed)
" ° °  01/10/2024  Patient ID: Jonathan Gross, male   DOB: 04/27/1965, 58 y.o.   MRN: [id number]  Pharmacy Quality Measure Review  This patient is appearing on a report for being at risk of failing the Controlling Blood Pressure measure this calendar year.   Last documented BP 140/90 on 12/13/23  Last documented A1c 10.4 on 12/13/23  Patient confirms he is getting all of his medications okay at this time and is monitoring BG with his CGM. Reports improved sugars since increasing mounjaro .   Not checking BP at home but does have a machine at home. Will begin monitoring at least once weekly and keep a log. Notified to contact our office sooner than scheduled f/u if BP chronically >140/90  Jon VEAR Lindau, PharmD Clinical Pharmacist [phone]   "

## 2024-03-12 ENCOUNTER — Ambulatory Visit: Admitting: Family Medicine
